# Patient Record
Sex: Female | Born: 1960 | Race: White | Hispanic: No | Marital: Married | Smoking: Never smoker
Health system: Southern US, Community
[De-identification: ages and names within clinical notes are randomized; demographics above are authoritative.]

## PROBLEM LIST (undated history)

## (undated) DIAGNOSIS — I10 Essential (primary) hypertension: Secondary | ICD-10-CM

## (undated) HISTORY — DX: Essential (primary) hypertension: I10

---

## 2009-07-29 ENCOUNTER — Ambulatory Visit (HOSPITAL_COMMUNITY): Admission: RE | Admit: 2009-07-29 | Discharge: 2009-07-29 | Payer: Self-pay | Admitting: Pulmonary Disease

## 2012-10-31 ENCOUNTER — Other Ambulatory Visit: Payer: Self-pay

## 2012-10-31 DIAGNOSIS — Z1231 Encounter for screening mammogram for malignant neoplasm of breast: Secondary | ICD-10-CM

## 2012-11-23 ENCOUNTER — Ambulatory Visit
Admission: RE | Admit: 2012-11-23 | Discharge: 2012-11-23 | Disposition: A | Discharge status: Home / Self Care | Payer: BC Managed Care – PPO | Source: Ambulatory Visit

## 2012-11-23 DIAGNOSIS — Z1231 Encounter for screening mammogram for malignant neoplasm of breast: Secondary | ICD-10-CM

## 2012-12-07 ENCOUNTER — Encounter: Payer: Self-pay | Admitting: Obstetrics and Gynecology

## 2012-12-17 ENCOUNTER — Ambulatory Visit: Payer: Self-pay | Admitting: Obstetrics and Gynecology

## 2012-12-28 ENCOUNTER — Ambulatory Visit (INDEPENDENT_AMBULATORY_CARE_PROVIDER_SITE_OTHER): Payer: BC Managed Care – PPO | Admitting: Obstetrics and Gynecology

## 2012-12-28 ENCOUNTER — Encounter: Payer: Self-pay | Admitting: Obstetrics and Gynecology

## 2012-12-28 VITALS — BP 130/76 | HR 80 | Ht 63.0 in | Wt 188.5 lb

## 2012-12-28 DIAGNOSIS — Z01419 Encounter for gynecological examination (general) (routine) without abnormal findings: Secondary | ICD-10-CM

## 2012-12-28 DIAGNOSIS — N951 Menopausal and female climacteric states: Secondary | ICD-10-CM

## 2012-12-28 DIAGNOSIS — Z1211 Encounter for screening for malignant neoplasm of colon: Secondary | ICD-10-CM

## 2012-12-28 DIAGNOSIS — R9389 Abnormal findings on diagnostic imaging of other specified body structures: Secondary | ICD-10-CM

## 2012-12-28 DIAGNOSIS — Z0000 Encounter for general adult medical examination without abnormal findings: Secondary | ICD-10-CM

## 2012-12-28 DIAGNOSIS — Z23 Encounter for immunization: Secondary | ICD-10-CM

## 2012-12-28 LAB — POCT URINALYSIS DIPSTICK
Leukocytes, UA: NEGATIVE
Nitrite, UA: NEGATIVE
pH, UA: 5

## 2012-12-28 NOTE — Progress Notes (Signed)
Patient tolerated Tdap well.  Given in right deltoid.

## 2012-12-28 NOTE — Patient Instructions (Addendum)
EXERCISE AND DIET:  We recommended that you start or continue a regular exercise program for good health. Regular exercise means any activity that makes your heart beat faster and makes you sweat.  We recommend exercising at least 30 minutes per day at least 3 days a week, preferably 4 or 5.  We also recommend a diet low in fat and sugar.  Inactivity, poor dietary choices and obesity can cause diabetes, heart attack, stroke, and kidney damage, among others.    ALCOHOL AND SMOKING:  Women should limit their alcohol intake to no more than 7 drinks/beers/glasses of wine (combined, not each!) per week. Moderation of alcohol intake to this level decreases your risk of breast cancer and liver damage. And of course, no recreational drugs are part of a healthy lifestyle.  And absolutely no smoking or even second hand smoke. Most people know smoking can cause heart and lung diseases, but did you know it also contributes to weakening of your bones? Aging of your skin?  Yellowing of your teeth and nails?  CALCIUM AND VITAMIN D:  Adequate intake of calcium and Vitamin D are recommended.  The recommendations for exact amounts of these supplements seem to change often, but generally speaking 600 mg of calcium (either carbonate or citrate) and 800 units of Vitamin D per day seems prudent. Certain women may benefit from higher intake of Vitamin D.  If you are among these women, your doctor will have told you during your visit.    PAP SMEARS:  Pap smears, to check for cervical cancer or precancers,  have traditionally been done yearly, although recent scientific advances have shown that most women can have pap smears less often.  However, every woman still should have a physical exam from her gynecologist every year. It will include a breast check, inspection of the vulva and vagina to check for abnormal growths or skin changes, a visual exam of the cervix, and then an exam to evaluate the size and shape of the uterus and  ovaries.  And after 50 years of age, a rectal exam is indicated to check for rectal cancers. We will also provide age appropriate advice regarding health maintenance, like when you should have certain vaccines, screening for sexually transmitted diseases, bone density testing, colonoscopy, mammograms, etc.   MAMMOGRAMS:  All women over 40 years old should have a yearly mammogram. Many facilities now offer a "3D" mammogram, which may cost around $50 extra out of pocket. If possible,  we recommend you accept the option to have the 3D mammogram performed.  It both reduces the number of women who will be called back for extra views which then turn out to be normal, and it is better than the routine mammogram at detecting truly abnormal areas.    COLONOSCOPY:  Colonoscopy to screen for colon cancer is recommended for all women at age 50.  We know, you hate the idea of the prep.  We agree, BUT, having colon cancer and not knowing it is worse!!  Colon cancer so often starts as a polyp that can be seen and removed at colonscopy, which can quite literally save your life!  And if your first colonoscopy is normal and you have no family history of colon cancer, most women don't have to have it again for 10 years.  Once every ten years, you can do something that may end up saving your life, right?  We will be happy to help you get it scheduled when you are ready.    Be sure to check your insurance coverage so you understand how much it will cost.  It may be covered as a preventative service at no cost, but you should check your particular policy.    Tetanus, Diphtheria, Pertussis (Tdap) Vaccine What You Need to Know WHY GET VACCINATED? Tetanus, diphtheria and pertussis can be very serious diseases, even for adolescents and adults. Tdap vaccine can protect us from these diseases. TETANUS (Lockjaw) causes painful muscle tightening and stiffness, usually all over the body.  It can lead to tightening of muscles in the head  and neck so you can't open your mouth, swallow, or sometimes even breathe. Tetanus kills about 1 out of 5 people who are infected. DIPHTHERIA can cause a thick coating to form in the back of the throat.  It can lead to breathing problems, paralysis, heart failure, and death. PERTUSSIS (Whooping Cough) causes severe coughing spells, which can cause difficulty breathing, vomiting and disturbed sleep.  It can also lead to weight loss, incontinence, and rib fractures. Up to 2 in 100 adolescents and 5 in 100 adults with pertussis are hospitalized or have complications, which could include pneumonia and death. These diseases are caused by bacteria. Diphtheria and pertussis are spread from person to person through coughing or sneezing. Tetanus enters the body through cuts, scratches, or wounds. Before vaccines, the [place] saw as many as 200,000 cases a year of diphtheria and pertussis, and hundreds of cases of tetanus. Since vaccination began, tetanus and diphtheria have dropped by about 99% and pertussis by about 80%. TDAP VACCINE Tdap vaccine can protect adolescents and adults from tetanus, diphtheria, and pertussis. One dose of Tdap is routinely given at age 11 or 12. People who did not get Tdap at that age should get it as soon as possible. Tdap is especially important for health care professionals and anyone having close contact with a baby younger than 12 months. Pregnant women should get a dose of Tdap during every pregnancy, to protect the newborn from pertussis. Infants are most at risk for severe, life-threatening complications from pertussis. A similar vaccine, called Td, protects from tetanus and diphtheria, but not pertussis. A Td booster should be given every 10 years. Tdap may be given as one of these boosters if you have not already gotten a dose. Tdap may also be given after a severe cut or burn to prevent tetanus infection. Your doctor can give you more information. Tdap may safely  be given at the same time as other vaccines. SOME PEOPLE SHOULD NOT GET THIS VACCINE  If you ever had a life-threatening allergic reaction after a dose of any tetanus, diphtheria, or pertussis containing vaccine, OR if you have a severe allergy to any part of this vaccine, you should not get Tdap. Tell your doctor if you have any severe allergies.  If you had a coma, or long or multiple seizures within 7 days after a childhood dose of DTP or DTaP, you should not get Tdap, unless a cause other than the vaccine was found. You can still get Td.  Talk to your doctor if you:  have epilepsy or another nervous system problem,  had severe pain or swelling after any vaccine containing diphtheria, tetanus or pertussis,  ever had Guillain-Barr Syndrome (GBS),  aren't feeling well on the day the shot is scheduled. RISKS OF A VACCINE REACTION With any medicine, including vaccines, there is a chance of side effects. These are usually mild and go away on their own, but serious   reactions are also possible. Brief fainting spells can follow a vaccination, leading to injuries from falling. Sitting or lying down for about 15 minutes can help prevent these. Tell your doctor if you feel dizzy or light-headed, or have vision changes or ringing in the ears. Mild problems following Tdap (Did not interfere with activities)  Pain where the shot was given (about 3 in 4 adolescents or 2 in 3 adults)  Redness or swelling where the shot was given (about 1 person in 5)  Mild fever of at least 100.75F (up to about 1 in 25 adolescents or 1 in 100 adults)  Headache (about 3 or 4 people in 10)  Tiredness (about 1 person in 3 or 4)  Nausea, vomiting, diarrhea, stomach ache (up to 1 in 4 adolescents or 1 in 10 adults)  Chills, body aches, sore joints, rash, swollen glands (uncommon) Moderate problems following Tdap (Interfered with activities, but did not require medical attention)  Pain where the shot was given  (about 1 in 5 adolescents or 1 in 100 adults)  Redness or swelling where the shot was given (up to about 1 in 16 adolescents or 1 in 25 adults)  Fever over 102F (about 1 in 100 adolescents or 1 in 250 adults)  Headache (about 3 in 20 adolescents or 1 in 10 adults)  Nausea, vomiting, diarrhea, stomach ache (up to 1 or 3 people in 100)  Swelling of the entire arm where the shot was given (up to about 3 in 100). Severe problems following Tdap (Unable to perform usual activities, required medical attention)  Swelling, severe pain, bleeding and redness in the arm where the shot was given (rare). A severe allergic reaction could occur after any vaccine (estimated less than 1 in a million doses). WHAT IF THERE IS A SERIOUS REACTION? What should I look for?  Look for anything that concerns you, such as signs of a severe allergic reaction, very high fever, or behavior changes. Signs of a severe allergic reaction can include hives, swelling of the face and throat, difficulty breathing, a fast heartbeat, dizziness, and weakness. These would start a few minutes to a few hours after the vaccination. What should I do?  If you think it is a severe allergic reaction or other emergency that can't wait, call 9-1-1 or get the person to the nearest hospital. Otherwise, call your doctor.  Afterward, the reaction should be reported to the "Vaccine Adverse Event Reporting System" (VAERS). Your doctor might file this report, or you can do it yourself through the VAERS web site at www.vaers.LAgents.no, or by calling 1-873-467-4987. VAERS is only for reporting reactions. They do not give medical advice.  THE NATIONAL VACCINE INJURY COMPENSATION PROGRAM The National Vaccine Injury Compensation Program (VICP) is a federal program that was created to compensate people who may have been injured by certain vaccines. Persons who believe they may have been injured by a vaccine can learn about the program and about filing a  claim by calling 1-(864) 053-5267 or visiting the VICP website at SpiritualWord.at. HOW CAN I LEARN MORE?  Ask your doctor.  Call your local or state health department.  Contact the Centers for Disease Control and Prevention (CDC):  Call (639) 038-0055 or visit CDC's website at PicCapture.uy. CDC Tdap Vaccine VIS (10/27/11) Document Released: 12/06/2011 Document Revised: 02/29/2012 Document Reviewed: 12/06/2011 ExitCare Patient Information 2014 [place], [place].    Please have your blood drawn after you have stopped your progesterone for 10 - 14 days.  I will see  you for your ultrasound appointment.

## 2012-12-28 NOTE — Progress Notes (Signed)
Patient ID: Cheyenne Christensen, female   DOB: 06/22/1960, 52 y.o.   MRN: [id number] 52 y.o.   Married    Caucasian   female   G2P2002   here for annual exam.    Patient with a history of dysfunctional uterine bleeding in early 2013.  Had a pelvic ultrasound and endometrial biopsy due to thickening of the endometrium at 15 mm.  EMB showed disordered proliferative endometrium.  No bleeding since June 2013, since starting progesterone therapy.  Patient has been running out of progesterone therapy.  When does not have Aygestin, has hot flashes.    Patient is concerned about potential elevated pressure.  Had 150/102.  Was having headaches.  Monitoring it and it has been 140s - 150s/90s - 100.  Had a normal BMET and urinalysis.  Dr. Juanetta Gosling who prescribed lisinopril, which the patient has now started.  Patient questioning if progesterone is causing elevation in blood pressure.  Here goal is to get off of her progesterone medication.    Asking about weight loss classes covered by [employer], her insurance company.    No LMP recorded. Patient is postmenopausal.          Sexually active: yes  The current method of family planning is post menopausal status and vasectomy Exercising: no Last mammogram:  11/2012 wnl:[facility] Last pap smear: 10/2011 wnl and negative HR HPV. History of abnormal pap: no Smoking: no Alcohol: no Last colonoscopy: never Last Bone Density:  never Last tetanus shot: 8-10 years ago - unsure. Last cholesterol check: 07/2012 wnl   Hgb: PCP                Urine: Trace RBC's   Family History  Problem Relation Age of Onset  . Hypertension Mother   . Deep vein thrombosis Mother   . Thyroid disease Mother   . Osteoporosis Mother   . Depression Mother   . Anxiety disorder Mother   . Thyroid disease Sister   . Anxiety disorder Sister   . Depression Sister     There are no active problems to display for this patient.   Past Medical History  Diagnosis Date  .  Hypertension     Past Surgical History  Procedure Laterality Date  . Cesarean section  1987    Allergies: Review of patient's allergies indicates no known allergies.  Current Outpatient Prescriptions  Medication Sig Dispense Refill  . Ibuprofen (ADVIL PO) Take by mouth.      Marland Kitchen lisinopril (PRINIVIL,ZESTRIL) 10 MG tablet Take 10 mg by mouth daily.      . norethindrone (AYGESTIN) 5 MG tablet Take 5 mg by mouth daily.       No current facility-administered medications for this visit.    ROS: Pertinent items are noted in HPI.  Social Hx:  Married.  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, [place]  Exam:    BP 130/76  Pulse 80  Ht 5\' 3"  (1.6 m)  Wt 188 lb 8 oz (85.503 kg)  BMI 33.4 kg/m2   Wt Readings from Last 3 Encounters:  12/28/12 188 lb 8 oz (85.503 kg)     Ht Readings from Last 3 Encounters:  12/28/12 5\' 3"  (1.6 m)    General appearance: alert, cooperative and appears stated age Head: Normocephalic, without obvious abnormality, atraumatic Neck: no adenopathy, supple, symmetrical, trachea midline and thyroid not enlarged, symmetric, no tenderness/mass/nodules Lungs: clear to auscultation bilaterally Breasts: Inspection negative, No nipple retraction or dimpling, No nipple  discharge or bleeding, No axillary or supraclavicular adenopathy, Normal to palpation without dominant masses Heart: regular rate and rhythm Abdomen: soft, non-tender; bowel sounds normal; no masses,  no organomegaly Extremities: extremities normal, atraumatic, no cyanosis or edema Skin: Skin color, texture, turgor normal. No rashes or lesions Lymph nodes: Cervical, supraclavicular, and axillary nodes normal. No abnormal inguinal nodes palpated Neurologic: Grossly normal   Pelvic: External genitalia:  no lesions              Urethra:  normal appearing urethra with no masses, tenderness or lesions              Bartholins and Skenes: normal                 Vagina: normal appearing vagina with  normal color and discharge, no lesions.  Small pea size left apical vaginal wall cyst palpable, soft.              Cervix: normal appearance              Pap taken: no        Bimanual Exam:  Uterus:  uterus is normal size, shape, consistency and nontender                                      Adnexa: normal adnexa in size, nontender and no masses                                      Rectovaginal: Confirms                                      Anus:  normal sphincter tone, no lesions  A: normal menopausal exam History of dysfunctional uterine bleeding and thickened endometrium.  On daily progesterone. Menopausal symptoms New diagnosis of hypertension. Obesity     P: mammogram yearly Do self breast exams pap smear in 2018. Stop progesterone and have FSH and estradiol checked 10 - 14 days later at [facility] lab in [place]. Return for pelvic ultrasound to re-evaluate lining. TDap today. Patient will contact [employer] to see what classes she is eligible for for weight loss. Return annually also.     An After Visit Summary was printed and given to the patient.

## 2013-01-02 ENCOUNTER — Encounter: Payer: Self-pay | Admitting: Internal Medicine

## 2013-01-02 ENCOUNTER — Telehealth: Payer: Self-pay | Admitting: Obstetrics and Gynecology

## 2013-01-02 NOTE — Telephone Encounter (Signed)
LMTCB to discuss insurance benefits.

## 2013-01-24 ENCOUNTER — Telehealth: Payer: Self-pay | Admitting: Obstetrics and Gynecology

## 2013-01-24 NOTE — Addendum Note (Signed)
Addended by: Clide Dales R on: 01/24/2013 10:58 AM   Modules accepted: Orders

## 2013-01-24 NOTE — Telephone Encounter (Signed)
Patient notified of lab orders have been release to [facility] on [address]  And may have lab work drawn now.

## 2013-01-24 NOTE — Telephone Encounter (Signed)
Patient has orders with [facility]  for lab work. However when she went to the location in [place] they could not fine orders

## 2013-02-05 ENCOUNTER — Other Ambulatory Visit: Payer: Self-pay | Admitting: Obstetrics and Gynecology

## 2013-02-05 LAB — ESTRADIOL: Estradiol: 108.7 pg/mL

## 2013-02-05 MED ORDER — NORETHINDRONE ACETATE 5 MG PO TABS: 5.0000 mg | ORAL_TABLET | Freq: Every day | ORAL | Status: DC

## 2013-02-06 ENCOUNTER — Telehealth: Payer: Self-pay

## 2013-02-06 NOTE — Telephone Encounter (Signed)
Message copied by Alphonsa Overall on Wed Feb 06, 2013  4:19 PM ------      Message from: Conley Simmonds      Created: Tue Feb 05, 2013  6:35 PM       Please inform the patient that she is not in full menopause yet.  Her natural estrogen levels are still high, and she is at risk for developing endometrial hyperplasia.      I recommend restarting her progesterone daily, and I will send a refill to her pharmacy through Epic.      I recommend proceeding with a pelvic ultrasound to evaluate the lining of her uterus.  If it is thickened, I recommend proceeding with an office biopsy as well. ------

## 2013-02-06 NOTE — Telephone Encounter (Signed)
LMOVM to call for lab results. 

## 2013-02-07 ENCOUNTER — Ambulatory Visit (INDEPENDENT_AMBULATORY_CARE_PROVIDER_SITE_OTHER): Payer: BC Managed Care – PPO

## 2013-02-07 ENCOUNTER — Ambulatory Visit (INDEPENDENT_AMBULATORY_CARE_PROVIDER_SITE_OTHER): Payer: BC Managed Care – PPO | Admitting: Obstetrics and Gynecology

## 2013-02-07 ENCOUNTER — Encounter: Payer: Self-pay | Admitting: Obstetrics and Gynecology

## 2013-02-07 VITALS — BP 122/82 | HR 80 | Ht 63.0 in | Wt 185.0 lb

## 2013-02-07 DIAGNOSIS — R9389 Abnormal findings on diagnostic imaging of other specified body structures: Secondary | ICD-10-CM

## 2013-02-07 NOTE — Telephone Encounter (Signed)
Patient given lab results and told to restart progesterone.  She has appointment for pelvic ultrasound tomorrow and advised pt. To keep Appointment and can discuss with Dr. Edward Jolly further at that time.

## 2013-02-07 NOTE — Progress Notes (Signed)
Subjective  Patient her for pelvic ultrasound and endometrial biopsy.  Has a history of thickened endometrium 15 mm on ultrasound 10/2011 at outside office. Had endometrial biopsy 11/2011 at [facility] office showing disordered proliferative endometrium, which prompted the progesterone treatment. Prescribed Aygestin 5 mg daily, which she stopped in June 2014.  No menses during treatment or after stopped Rx. FSH 34 and estradiol 108.7 on 02/04/13 at this office  Brother in law deceased this week due to massive MI.  Objective   See ultrasound below.  Uterus with EMS 11 mm and cystic changes. Ovarian follicles noted.     Endometrial biopsy  Consent performed. Speculum placed in the vagina.  Sterile prep with Hibiclens. Tenaculum to anterior cervical lip. Pipelle passed twice into endometrial cavity and suction applied each time.  Tissue to pathology. Minimal EBL. No complications  Assessment  Perimenopausal female. Cystic endometrium. Recent discontinuation of Aygestin.  Plan  Will follow up on endometrial biopsy. Anticipate continuation of progesterone therapy, but will wait for the biopsy results to return.

## 2013-02-07 NOTE — Patient Instructions (Addendum)
Endometrial Biopsy This is a test in which a tissue sample (a biopsy) is taken from inside the uterus (womb). It is then looked at by a specialist under a microscope to see if the tissue is normal or abnormal. The endometrium is the lining of the uterus. This test helps determine where you are in your menstrual cycle and how hormone levels are affecting the lining of the uterus. Another use for this test is to diagnose endometrial cancer, tuberculosis, polyps, or inflammatory conditions and to evaluate uterine bleeding. PREPARATION FOR TEST No preparation or fasting is necessary. NORMAL FINDINGS No pathologic conditions. Presence of "secretory-type" endometrium 3 to 5 days before to normal menstruation. Ranges for normal findings may vary among different laboratories and hospitals. You should always check with your doctor after having lab work or other tests done to discuss the meaning of your test results and whether your values are considered within normal limits. MEANING OF TEST  Your caregiver will go over the test results with you and discuss the importance and meaning of your results, as well as treatment options and the need for additional tests if necessary. OBTAINING THE TEST RESULTS It is your responsibility to obtain your test results. Ask the lab or department performing the test when and how you will get your results. Document Released: 10/07/2004 Document Revised: 08/29/2011 Document Reviewed: 05/16/2008 ExitCare Patient Information 2014 [employer].  

## 2013-02-11 ENCOUNTER — Telehealth: Payer: Self-pay | Admitting: Obstetrics and Gynecology

## 2013-02-11 NOTE — Telephone Encounter (Signed)
Spoke with patient and advised her I have not called her today.  Patient states she had a message stating to call me.  When patient actually Looks at date of call it was dated 02-06-13.  Advised patient probably a message to call to keep appointment for ultrasound and to start Back on progesterone.  Advised pt. Will call her with endometrial bx results when we receive them.

## 2013-02-11 NOTE — Telephone Encounter (Signed)
Patient calling you back 

## 2013-02-12 ENCOUNTER — Telehealth: Payer: Self-pay | Admitting: Obstetrics and Gynecology

## 2013-02-12 LAB — IPS CERVICAL/ECC/EMB/VULVAR/VAGINAL BIOPSY

## 2013-02-12 NOTE — Telephone Encounter (Signed)
Phone call with patient directly to share endometrial biopsy results.  Benign endometrial polyp.  I recommend proceeding with polyp removal and dilation and curettage.  Patient will now schedule an appointment to discuss this further with me.

## 2013-02-22 ENCOUNTER — Encounter: Payer: Self-pay | Admitting: Obstetrics and Gynecology

## 2013-02-22 ENCOUNTER — Ambulatory Visit (INDEPENDENT_AMBULATORY_CARE_PROVIDER_SITE_OTHER): Payer: BC Managed Care – PPO | Admitting: Obstetrics and Gynecology

## 2013-02-22 VITALS — BP 122/82 | HR 80 | Ht 63.0 in | Wt 189.0 lb

## 2013-02-22 DIAGNOSIS — N840 Polyp of corpus uteri: Secondary | ICD-10-CM

## 2013-02-22 NOTE — Patient Instructions (Signed)
Please refer to your handout on hysteroscopy and dilation and curettage.

## 2013-02-24 ENCOUNTER — Encounter: Payer: Self-pay | Admitting: Obstetrics and Gynecology

## 2013-02-24 NOTE — Progress Notes (Signed)
Patient ID: Cheyenne Christensen, female   DOB: 30-Dec-1960, 52 y.o.   MRN: [id number]  Subjective  Patient, husband, and daughter present for discussion of surgery.   Has a history of thickened endometrium 15 mm on ultrasound 10/2011 at outside office.  Had endometrial biopsy 11/2011 at same office showing disordered proliferative endometrium, which prompted the progesterone treatment.  Prescribed Aygestin 5 mg daily, which she stopped in June 2014. No menses during treatment or after stopped Rx.   FSH 34 and estradiol 108.7 on 02/04/13 at this office  Had pelvic ultrasound 02/07/13 showing uterus with EMS 11 mm and cystic changes. Ovarian follicles noted.  Endometrial biopsy 02/07/13 - disordered proliferative endometrium suggestive of a polyp.  Objective  No exam.  Assessment  Perimenopausal female. Endometrial polyp.  Plan  Proceed with hysteroscopic polypectomy with dilation and curettage.  Risks, benefits, and alternatives discussed.  Risks include but are not limited to bleeding, infection, damage to surrounding organs, uterine perforation, DVT, PE, death, reaction to anesthesia, and pulmonary edema.  Patient wishes to proceed.

## 2013-03-01 ENCOUNTER — Encounter: Payer: BC Managed Care – PPO | Admitting: Internal Medicine

## 2013-03-18 ENCOUNTER — Encounter: Payer: Self-pay | Admitting: Obstetrics and Gynecology

## 2013-03-19 ENCOUNTER — Telehealth: Payer: Self-pay | Admitting: *Deleted

## 2013-03-19 NOTE — Telephone Encounter (Signed)
Patient returned call.  She has previously talked with Carolynn in insurance and told her she wanted to hold off on scheduling surgery.  Advised that this is a diagnostic procedure that is being recommended to rule out any abnormal cells as the cause of her bleeding and thickened endometrium. Patient states she has a mom with cancer and a grandson's birthday as well as commitments at work that she feels she "wants' to take care of prior to procedure.  Also needs this time to arrange payment.  She feels like the earliest she can schedule is end of Oct and/or early Nov.  Advised again, need to rule out abnormal cells and would not recommend waiting beyond this time frame.    Patient states she has not taken amy Aygestin since June after seeing Dr Edward Jolly in July and having labs done.Hasnt had bleeding since then until started spotting over last two days and notices a gradual increasing of flow.  Discussed that this may persist until procedure.  Patient is agreeable to schedule for OCT 28.  Anything to do before then?   Will schedule procedure and then call her to schedule pre/post op.

## 2013-03-19 NOTE — Telephone Encounter (Signed)
Kennon Rounds,  Thank you for contacting the patient to schedule surgery.  Nothing to do in the interim.  Her prior progesterone therapy was prevention against hyperplasia, although the patient never had this.  Now she actually has endometrial pathology, a polyp.  Have her just keep a bleeding calendar and contact office if she develops heavy bleeding.  [employer]

## 2013-03-19 NOTE — Telephone Encounter (Signed)
Call to patient to follow up on scheduling surgical procedure. LMTCB.

## 2013-03-20 NOTE — Telephone Encounter (Signed)
LM, everything scheduled as we discussed.  Call back at her convenince to review instructions from dr Edward Jolly regarding Aygestin and surgery instructions.

## 2013-03-20 NOTE — Telephone Encounter (Signed)
Patient returned call.  Given instructions to monitor and track bleeding and call if becomes heavy. No Aygestin for now.  Patient states it is continuing to increase.  Discussed surgical date of 04-16-13 and instructions given, mailed and pre/post op appts scheduled.

## 2013-03-21 ENCOUNTER — Telehealth: Payer: Self-pay | Admitting: Obstetrics and Gynecology

## 2013-03-21 NOTE — Telephone Encounter (Signed)
Spoke with patient about her payment due date for surgery. Patient will pay on 10/16 at her pre-op appt.

## 2013-04-02 ENCOUNTER — Encounter (HOSPITAL_COMMUNITY): Payer: Self-pay

## 2013-04-04 ENCOUNTER — Encounter: Payer: Self-pay | Admitting: Obstetrics and Gynecology

## 2013-04-04 ENCOUNTER — Ambulatory Visit (INDEPENDENT_AMBULATORY_CARE_PROVIDER_SITE_OTHER): Payer: BC Managed Care – PPO | Admitting: Obstetrics and Gynecology

## 2013-04-04 VITALS — BP 120/74 | HR 60 | Temp 98.0°F | Ht 63.0 in | Wt 183.0 lb

## 2013-04-04 DIAGNOSIS — N840 Polyp of corpus uteri: Secondary | ICD-10-CM

## 2013-04-04 NOTE — Progress Notes (Signed)
Patient ID: Cheyenne Christensen, female   DOB: 06/05/1961, 52 y.o.   MRN: [id number] GYNECOLOGY PROBLEM VISIT  PCP:  Shaune Pollack  Referring provider:   HPI: 52 y.o.   Married  Caucasian  female   G2P2002 with No LMP recorded. Patient is postmenopausal.   here for   Discuss surgery.  Scheduled for October 28.   Bleeding episode started 03/17/13 and stopped a little over a week ago.    Has a history of thickened endometrium 15 mm on ultrasound 10/2011 at outside office.  Had endometrial biopsy 11/2011 at same office showing disordered proliferative endometrium, which prompted the progesterone treatment.  Prescribed Aygestin 5 mg daily, which she stopped in June 2014. No menses during treatment or after stopped Rx.  FSH 34 and estradiol 108.7 on 02/04/13 at this office  Had pelvic ultrasound 02/07/13 showing uterus with EMS 11 mm and cystic changes. Ovarian follicles noted.  Endometrial biopsy 02/07/13 - disordered proliferative endometrium suggestive of a polyp.   GYNECOLOGIC HISTORY: No LMP recorded. Patient is postmenopausal. Sexually active:  yes Partner preference: female Contraception:   postmenopausal Menopausal hormone therapy: no DES exposure:   no Blood transfusions:  no  Sexually transmitted diseases:  no GYN Procedures:  C-section 4098 Mammogram:   11/2012 wnl:[facility]              Pap:   10/2011 wnl and Neg HR HPV History of abnormal pap smear:  no   OB History   Grav Para Term Preterm Abortions TAB SAB Ect Mult Living   2 2 2       2          Family History  Problem Relation Age of Onset  . Hypertension Mother   . Deep vein thrombosis Mother   . Thyroid disease Mother   . Osteoporosis Mother   . Depression Mother   . Anxiety disorder Mother   . Thyroid disease Sister   . Anxiety disorder Sister   . Depression Sister     There are no active problems to display for this patient.   Past Medical History  Diagnosis Date  . Hypertension     Past Surgical  History  Procedure Laterality Date  . Cesarean section  1987    ALLERGIES: Review of patient's allergies indicates no known allergies.  Current Outpatient Prescriptions  Medication Sig Dispense Refill  . acetaminophen (TYLENOL) 500 MG tablet Take 1,000 mg by mouth daily as needed for pain.      Marland Kitchen lisinopril (PRINIVIL,ZESTRIL) 10 MG tablet Take 10 mg by mouth daily.      . Ibuprofen (ADVIL PO) Take 400-600 mg by mouth daily as needed (pain).        No current facility-administered medications for this visit.     ROS:  Pertinent items are noted in HPI.  SOCIAL HISTORY:  Married.   PHYSICAL EXAMINATION:    BP 120/74  Pulse 60  Temp(Src) 98 F (36.7 C) (Oral)  Ht 5\' 3"  (1.6 m)  Wt 183 lb (83.008 kg)  BMI 32.43 kg/m2   Wt Readings from Last 3 Encounters:  04/04/13 183 lb (83.008 kg)  02/22/13 189 lb (85.73 kg)  02/07/13 185 lb (83.915 kg)     Ht Readings from Last 3 Encounters:  04/04/13 5\' 3"  (1.6 m)  02/22/13 5\' 3"  (1.6 m)  02/07/13 5\' 3"  (1.6 m)    General appearance: alert, cooperative and appears stated age Head: Normocephalic, without obvious abnormality, atraumatic Neck: no  adenopathy, supple, symmetrical, trachea midline and thyroid not enlarged, symmetric, no tenderness/mass/nodules Lungs: clear to auscultation bilaterally Breasts: Inspection negative, No nipple retraction or dimpling, No nipple discharge or bleeding, No axillary or supraclavicular adenopathy, Normal to palpation without dominant masses Heart: regular rate and rhythm Abdomen: soft, non-tender; no masses,  no organomegaly Extremities: extremities normal, atraumatic, no cyanosis or edema Skin: Skin color, texture, turgor normal. No rashes or lesions Lymph nodes: Cervical, supraclavicular, and axillary nodes normal. No abnormal inguinal nodes palpated Neurologic: Grossly normal  Pelvic: External genitalia:  no lesions              Urethra:  normal appearing urethra with no masses, tenderness or  lesions              Bartholins and Skenes: normal                 Vagina: normal appearing vagina with normal color and discharge, no lesions              Cervix: normal appearance                      Bimanual Exam:  Uterus:  uterus is normal size, shape, consistency and nontender                                      Adnexa: normal adnexa in size, nontender and no masses                                      Rectovaginal: Confirms                                      Anus:  normal sphincter tone, no lesions  ASSESSMENT   Perimenopausal female.  Endometrial polyp.  Previous Aygestin therapy.   PLAN  Proceed with hysteroscopic polypectomy with dilation and curettage. Risks, benefits, and alternatives discussed. Risks include but are not limited to bleeding, infection, damage to surrounding organs, uterine perforation, DVT, PE, death, reaction to anesthesia, and pulmonary edema. Patient wishes to proceed.      An After Visit Summary was printed and given to the patient.

## 2013-04-04 NOTE — Patient Instructions (Signed)
I will see you the day of surgery!  Please call if you need anything before then.

## 2013-04-11 ENCOUNTER — Encounter (HOSPITAL_COMMUNITY): Payer: Self-pay

## 2013-04-11 ENCOUNTER — Encounter (HOSPITAL_COMMUNITY)
Admission: RE | Admit: 2013-04-11 | Discharge: 2013-04-11 | Disposition: A | Discharge status: Home / Self Care | Payer: BC Managed Care – PPO | Source: Ambulatory Visit | Attending: Obstetrics and Gynecology | Admitting: Obstetrics and Gynecology

## 2013-04-11 ENCOUNTER — Other Ambulatory Visit: Payer: Self-pay

## 2013-04-11 DIAGNOSIS — Z01812 Encounter for preprocedural laboratory examination: Secondary | ICD-10-CM | POA: Insufficient documentation

## 2013-04-11 DIAGNOSIS — Z01818 Encounter for other preprocedural examination: Secondary | ICD-10-CM | POA: Insufficient documentation

## 2013-04-11 DIAGNOSIS — Z01810 Encounter for preprocedural cardiovascular examination: Secondary | ICD-10-CM | POA: Insufficient documentation

## 2013-04-11 LAB — BASIC METABOLIC PANEL
Calcium: 9.2 mg/dL (ref 8.4–10.5)
Creatinine, Ser: 0.7 mg/dL (ref 0.50–1.10)
GFR calc non Af Amer: >90 mL/min (ref >90)
Sodium: 136 mEq/L (ref 135–145)

## 2013-04-11 LAB — CBC
Hemoglobin: 13.6 g/dL (ref 12.0–15.0)
Platelets: 230 10*3/uL (ref 150–400)
RBC: 4.41 MIL/uL (ref 3.87–5.11)
WBC: 4.7 10*3/uL (ref 4.0–10.5)

## 2013-04-11 NOTE — Patient Instructions (Addendum)
   Your procedure is scheduled on: Tuesday, Oct 28  Enter through the Main Entrance of [facility] at:  10 am Pick up the phone at the desk and dial [phone] and inform us of your arrival.  Please call this number if you have any problems the morning of surgery: [phone]  Remember: Do not eat or drink after midnight: Monday Take these medicines the morning of surgery with a SIP OF WATER: none  Do not wear jewelry, make-up, or FINGER nail polish No metal in your hair or on your body. Do not wear lotions, powders, perfumes. You may wear deodorant.  Please use your CHG wash as directed prior to surgery.  Do not shave anywhere for at least 12 hours prior to first CHG shower.  Do not bring valuables to the hospital. Contacts, dentures or bridgework may not be worn into surgery.  Patients discharged on the day of surgery will not be allowed to drive home.  Home with husband Gery Pray [phone].

## 2013-04-16 ENCOUNTER — Encounter (HOSPITAL_COMMUNITY): Payer: BC Managed Care – PPO | Admitting: Certified Registered Nurse Anesthetist

## 2013-04-16 ENCOUNTER — Encounter (HOSPITAL_COMMUNITY)
Admission: RE | Disposition: A | Discharge status: Home / Self Care | Payer: Self-pay | Source: Ambulatory Visit | Attending: Obstetrics and Gynecology

## 2013-04-16 ENCOUNTER — Ambulatory Visit (HOSPITAL_COMMUNITY)
Admission: RE | Admit: 2013-04-16 | Discharge: 2013-04-16 | Disposition: A | Discharge status: Home / Self Care | Payer: BC Managed Care – PPO | Source: Ambulatory Visit | Attending: Obstetrics and Gynecology | Admitting: Obstetrics and Gynecology

## 2013-04-16 ENCOUNTER — Ambulatory Visit (HOSPITAL_COMMUNITY): Payer: BC Managed Care – PPO | Admitting: Certified Registered Nurse Anesthetist

## 2013-04-16 DIAGNOSIS — R9389 Abnormal findings on diagnostic imaging of other specified body structures: Secondary | ICD-10-CM | POA: Insufficient documentation

## 2013-04-16 DIAGNOSIS — N840 Polyp of corpus uteri: Secondary | ICD-10-CM | POA: Insufficient documentation

## 2013-04-16 DIAGNOSIS — N8500 Endometrial hyperplasia, unspecified: Secondary | ICD-10-CM

## 2013-04-16 HISTORY — PX: DILATATION & CURRETTAGE/HYSTEROSCOPY WITH RESECTOCOPE: SHX5572

## 2013-04-16 SURGERY — DILATATION & CURETTAGE/HYSTEROSCOPY WITH RESECTOCOPE
Anesthesia: General | Site: Vagina | Wound class: Clean Contaminated

## 2013-04-16 MED ORDER — KETOROLAC TROMETHAMINE 30 MG/ML IJ SOLN
INTRAMUSCULAR | Status: AC
  Filled 2013-04-16: qty 1

## 2013-04-16 MED ORDER — LIDOCAINE HCL (CARDIAC) 20 MG/ML IV SOLN
INTRAVENOUS | Status: DC | PRN
  Administered 2013-04-16: 50 mg via INTRAVENOUS

## 2013-04-16 MED ORDER — LIDOCAINE HCL (CARDIAC) 20 MG/ML IV SOLN
INTRAVENOUS | Status: AC
  Filled 2013-04-16: qty 5

## 2013-04-16 MED ORDER — MIDAZOLAM HCL 2 MG/2ML IJ SOLN
INTRAMUSCULAR | Status: AC
  Filled 2013-04-16: qty 2

## 2013-04-16 MED ORDER — DEXAMETHASONE SODIUM PHOSPHATE 10 MG/ML IJ SOLN
INTRAMUSCULAR | Status: DC | PRN
  Administered 2013-04-16: 10 mg via INTRAVENOUS

## 2013-04-16 MED ORDER — PROMETHAZINE HCL 25 MG/ML IJ SOLN: 6.2500 mg | INTRAMUSCULAR | Status: DC | PRN

## 2013-04-16 MED ORDER — LIDOCAINE HCL 1 % IJ SOLN
INTRAMUSCULAR | Status: DC | PRN
  Administered 2013-04-16: 10 mL

## 2013-04-16 MED ORDER — MIDAZOLAM HCL 2 MG/2ML IJ SOLN
INTRAMUSCULAR | Status: DC | PRN
  Administered 2013-04-16: 2 mg via INTRAVENOUS

## 2013-04-16 MED ORDER — GLYCOPYRROLATE 0.2 MG/ML IJ SOLN
INTRAMUSCULAR | Status: AC
  Filled 2013-04-16: qty 1

## 2013-04-16 MED ORDER — FENTANYL CITRATE 0.05 MG/ML IJ SOLN
INTRAMUSCULAR | Status: AC
  Filled 2013-04-16: qty 2

## 2013-04-16 MED ORDER — ONDANSETRON HCL 4 MG/2ML IJ SOLN
INTRAMUSCULAR | Status: DC | PRN
  Administered 2013-04-16: 4 mg via INTRAVENOUS

## 2013-04-16 MED ORDER — LIDOCAINE HCL 1 % IJ SOLN
INTRAMUSCULAR | Status: AC
  Filled 2013-04-16: qty 20

## 2013-04-16 MED ORDER — GLYCINE 1.5 % IR SOLN
Status: DC | PRN
  Administered 2013-04-16: 3000 mL

## 2013-04-16 MED ORDER — DEXAMETHASONE SODIUM PHOSPHATE 10 MG/ML IJ SOLN
INTRAMUSCULAR | Status: AC
  Filled 2013-04-16: qty 1

## 2013-04-16 MED ORDER — ONDANSETRON HCL 4 MG/2ML IJ SOLN
INTRAMUSCULAR | Status: AC
  Filled 2013-04-16: qty 2

## 2013-04-16 MED ORDER — LACTATED RINGERS IV SOLN: INTRAVENOUS | Status: DC

## 2013-04-16 MED ORDER — FENTANYL CITRATE 0.05 MG/ML IJ SOLN: 25.0000 ug | INTRAMUSCULAR | Status: DC | PRN

## 2013-04-16 MED ORDER — KETOROLAC TROMETHAMINE 30 MG/ML IJ SOLN
INTRAMUSCULAR | Status: DC | PRN
  Administered 2013-04-16: 30 mg via INTRAVENOUS

## 2013-04-16 MED ORDER — LACTATED RINGERS IV SOLN
INTRAVENOUS | Status: DC
  Administered 2013-04-16 (×2): via INTRAVENOUS

## 2013-04-16 MED ORDER — MIDAZOLAM HCL 2 MG/2ML IJ SOLN: 0.5000 mg | Freq: Once | INTRAMUSCULAR | Status: DC | PRN

## 2013-04-16 MED ORDER — PROPOFOL 10 MG/ML IV BOLUS
INTRAVENOUS | Status: DC | PRN
  Administered 2013-04-16: 200 mg via INTRAVENOUS

## 2013-04-16 MED ORDER — FENTANYL CITRATE 0.05 MG/ML IJ SOLN
INTRAMUSCULAR | Status: DC | PRN
  Administered 2013-04-16 (×2): 50 ug via INTRAVENOUS

## 2013-04-16 MED ORDER — KETOROLAC TROMETHAMINE 30 MG/ML IJ SOLN: 15.0000 mg | Freq: Once | INTRAMUSCULAR | Status: DC | PRN

## 2013-04-16 MED ORDER — GLYCOPYRROLATE 0.2 MG/ML IJ SOLN
INTRAMUSCULAR | Status: DC | PRN
  Administered 2013-04-16: 0.2 mg via INTRAVENOUS

## 2013-04-16 MED ORDER — MEPERIDINE HCL 25 MG/ML IJ SOLN: 6.2500 mg | INTRAMUSCULAR | Status: DC | PRN

## 2013-04-16 SURGICAL SUPPLY — 18 items
CANISTER SUCT 3000ML (MISCELLANEOUS) ×2 IMPLANT
CATH ROBINSON RED A/P 16FR (CATHETERS) ×2 IMPLANT
CLOTH BEACON ORANGE TIMEOUT ST (SAFETY) ×2 IMPLANT
CONTAINER PREFILL 10% NBF 60ML (FORM) ×4 IMPLANT
DRESSING TELFA 8X3 (GAUZE/BANDAGES/DRESSINGS) ×2 IMPLANT
ELECT REM PT RETURN 9FT ADLT (ELECTROSURGICAL) ×2
ELECTRODE REM PT RTRN 9FT ADLT (ELECTROSURGICAL) IMPLANT
GLOVE BIO SURGEON STRL SZ 6.5 (GLOVE) ×2 IMPLANT
GLOVE BIOGEL PI IND STRL 7.0 (GLOVE) ×1 IMPLANT
GLOVE BIOGEL PI INDICATOR 7.0 (GLOVE) ×1
GOWN STRL REIN XL XLG (GOWN DISPOSABLE) ×4 IMPLANT
LOOP ANGLED CUTTING 22FR (CUTTING LOOP) IMPLANT
NDL SPNL 20GX3.5 QUINCKE YW (NEEDLE) IMPLANT
NEEDLE SPNL 20GX3.5 QUINCKE YW (NEEDLE) ×2 IMPLANT
PACK HYSTEROSCOPY LF (CUSTOM PROCEDURE TRAY) ×2 IMPLANT
PAD OB MATERNITY 4.3X12.25 (PERSONAL CARE ITEMS) ×2 IMPLANT
TOWEL OR 17X24 6PK STRL BLUE (TOWEL DISPOSABLE) ×4 IMPLANT
WATER STERILE IRR 1000ML POUR (IV SOLUTION) ×2 IMPLANT

## 2013-04-16 NOTE — Anesthesia Postprocedure Evaluation (Signed)
Anesthesia Post Note  Patient: Cheyenne Christensen  Procedure(s) Performed: Procedure(s) (LRB): DILATATION & CURETTAGE/HYSTEROSCOPY (N/A)  Anesthesia type: General  Patient location: PACU  Post pain: Pain level controlled  Post assessment: Post-op Vital signs reviewed  Last Vitals:  Filed Vitals:   04/16/13 1236  BP: 113/84  Pulse: 93  Temp: 36.6 C  Resp: 16    Post vital signs: Reviewed  Level of consciousness: sedated  Complications: No apparent anesthesia complications

## 2013-04-16 NOTE — Transfer of Care (Signed)
Immediate Anesthesia Transfer of Care Note  Patient: Cheyenne Christensen  Procedure(s) Performed: Procedure(s): DILATATION & CURETTAGE/HYSTEROSCOPY (N/A)  Patient Location: PACU  Anesthesia Type:General  Level of Consciousness: awake, alert  and oriented  Airway & Oxygen Therapy: Patient Spontanous Breathing and Patient connected to nasal cannula oxygen  Post-op Assessment: Report given to PACU RN, Post -op Vital signs reviewed and stable and Patient moving all extremities X 4  Post vital signs: Reviewed and stable  Complications: No apparent anesthesia complications

## 2013-04-16 NOTE — Brief Op Note (Signed)
04/16/2013  12:35 PM  PATIENT:  Cheyenne Christensen  52 y.o. female  PRE-OPERATIVE DIAGNOSIS:  endometrial polyp with thickened endometrial lining. CPT [id number]  POST-OPERATIVE DIAGNOSIS:  Thickened endometrial lining.  PROCEDURE:  Procedure(s): DILATATION & CURETTAGE/HYSTEROSCOPY (N/A)  SURGEON:  Surgeon(s) and Role:    * Melony Overly, MD - Primary  PHYSICIAN ASSISTANT:   ASSISTANTS: none   ANESTHESIA:   LMA, paracervical block  EBL:  Total I/O In: 1500 [I.V.:1500] Out: 55 [Urine:50; Blood:5]  BLOOD ADMINISTERED:none  DRAINS: none   LOCAL MEDICATIONS USED:  LIDOCAINE  and Amount:  10 ml  SPECIMEN:  Source of Specimen:   endometrial curettings  DISPOSITION OF SPECIMEN:  PATHOLOGY  COUNTS:  YES  TOURNIQUET:  * No tourniquets in log *  DICTATION: .Other Dictation: Dictation Number    PLAN OF CARE: Discharge to home after PACU  PATIENT DISPOSITION:  PACU - hemodynamically stable.   Delay start of Pharmacological VTE agent (>24hrs) due to surgical blood loss or risk of bleeding: not applicable

## 2013-04-16 NOTE — H&P (Signed)
Patient ID: Cheyenne Christensen, female   DOB: [birth year], 52 y.o.   MRN: [id number] GYNECOLOGY PROBLEM VISIT  PCP:  Shaune Pollack  Referring provider:   HPI: 52 y.o.   Married  Caucasian  female    G2P2002 with No LMP recorded. Patient is postmenopausal.    here for   Discuss surgery.  Scheduled for October 28.   Bleeding episode started 03/17/13 and stopped a little over a week ago.    Has a history of thickened endometrium 15 mm on ultrasound 10/2011 at outside office.   Had endometrial biopsy 11/2011 at same office showing disordered proliferative endometrium, which prompted the progesterone treatment.   Prescribed Aygestin 5 mg daily, which she stopped in June 2014. No menses during treatment or after stopped Rx.   FSH 34 and estradiol 108.7 on 02/04/13 at this office   Had pelvic ultrasound 02/07/13 showing uterus with EMS 11 mm and cystic changes. Ovarian follicles noted.   Endometrial biopsy 02/07/13 - disordered proliferative endometrium suggestive of a polyp.   GYNECOLOGIC HISTORY: No LMP recorded. Patient is postmenopausal. Sexually active:  yes Partner preference: female Contraception:   postmenopausal Menopausal hormone therapy: no DES exposure:   no Blood transfusions:  no   Sexually transmitted diseases:  no GYN Procedures:  C-section 5621 Mammogram:   11/2012 wnl:[facility]               Pap:   10/2011 wnl and Neg HR HPV History of abnormal pap smear:  no    OB History     Grav  Para  Term  Preterm  Abortions  TAB  SAB  Ect  Mult  Living     2  2  2              2              Family History   Problem  Relation  Age of Onset   .  Hypertension  Mother     .  Deep vein thrombosis  Mother     .  Thyroid disease  Mother     .  Osteoporosis  Mother     .  Depression  Mother     .  Anxiety disorder  Mother     .  Thyroid disease  Sister     .  Anxiety disorder  Sister     .  Depression  Sister       There are no active problems to display for this patient.    Past Medical History   Diagnosis  Date   .  Hypertension         Past Surgical History   Procedure  Laterality  Date   .  Cesarean section    1987     ALLERGIES: Review of patient's allergies indicates no known allergies.    Current Outpatient Prescriptions   Medication  Sig  Dispense  Refill   .  acetaminophen (TYLENOL) 500 MG tablet  Take 1,000 mg by mouth daily as needed for pain.         Marland Kitchen  lisinopril (PRINIVIL,ZESTRIL) 10 MG tablet  Take 10 mg by mouth daily.         .  Ibuprofen (ADVIL PO)  Take 400-600 mg by mouth daily as needed (pain).             No current facility-administered medications for this visit.      ROS:  Pertinent items  are noted in HPI.  SOCIAL HISTORY:  Married.   PHYSICAL EXAMINATION:    BP 120/74  Pulse 60  Temp(Src) 98 F (36.7 C) (Oral)  Ht 5\' 3"  (1.6 m)  Wt 183 lb (83.008 kg)  BMI 32.43 kg/m2    Wt Readings from Last 3 Encounters:   04/04/13  183 lb (83.008 kg)   02/22/13  189 lb (85.73 kg)   02/07/13  185 lb (83.915 kg)       Ht Readings from Last 3 Encounters:   04/04/13  5\' 3"  (1.6 m)   02/22/13  5\' 3"  (1.6 m)   02/07/13  5\' 3"  (1.6 m)     General appearance: alert, cooperative and appears stated age Head: Normocephalic, without obvious abnormality, atraumatic Neck: no adenopathy, supple, symmetrical, trachea midline and thyroid not enlarged, symmetric, no tenderness/mass/nodules Lungs: clear to auscultation bilaterally Breasts: Inspection negative, No nipple retraction or dimpling, No nipple discharge or bleeding, No axillary or supraclavicular adenopathy, Normal to palpation without dominant masses Heart: regular rate and rhythm Abdomen: soft, non-tender; no masses,  no organomegaly Extremities: extremities normal, atraumatic, no cyanosis or edema Skin: Skin color, texture, turgor normal. No rashes or lesions Lymph nodes: Cervical, supraclavicular, and axillary nodes normal. No abnormal inguinal nodes palpated Neurologic:  Grossly normal  Pelvic: External genitalia:  no lesions              Urethra:  normal appearing urethra with no masses, tenderness or lesions              Bartholins and Skenes: normal                  Vagina: normal appearing vagina with normal color and discharge, no lesions              Cervix: normal appearance                       Bimanual Exam:  Uterus:  uterus is normal size, shape, consistency and nontender                                      Adnexa: normal adnexa in size, nontender and no masses                                      Rectovaginal: Confirms                                      Anus:  normal sphincter tone, no lesions  ASSESSMENT    Perimenopausal female.   Endometrial polyp.   Previous Aygestin therapy.    PLAN  Proceed with hysteroscopic polypectomy with dilation and curettage. Risks, benefits, and alternatives discussed. Risks include but are not limited to bleeding, infection, damage to surrounding organs, uterine perforation, DVT, PE, death, reaction to anesthesia, and pulmonary edema. Patient wishes to proceed.       An After Visit Summary was printed and given to the patient.                Revision History       Date/Time User Action    > 04/04/2013  8:31 AM Melony Overly, MD Sign  04/04/2013  8:04 AM Alphonsa Overall, CMA Sign at close encounter               Encounter-Level Documents:    Electronic signature on 04/04/2013 7:17 AM

## 2013-04-16 NOTE — Anesthesia Preprocedure Evaluation (Signed)
Anesthesia Evaluation  Patient identified by MRN, date of birth, ID band Patient awake    Reviewed: Allergy & Precautions, H&P , Patient's Chart, lab work & pertinent test results, reviewed documented beta blocker date and time   History of Anesthesia Complications Negative for: history of anesthetic complications  Airway Mallampati: II TM Distance: >3 FB Neck ROM: full    Dental no notable dental hx.    Pulmonary neg pulmonary ROS,  breath sounds clear to auscultation  Pulmonary exam normal       Cardiovascular Exercise Tolerance: Good hypertension, negative cardio ROS  Rhythm:regular Rate:Normal     Neuro/Psych negative neurological ROS  negative psych ROS   GI/Hepatic negative GI ROS, Neg liver ROS,   Endo/Other  negative endocrine ROS  Renal/GU negative Renal ROS     Musculoskeletal   Abdominal   Peds  Hematology negative hematology ROS (+)   Anesthesia Other Findings   Reproductive/Obstetrics negative OB ROS                           Anesthesia Physical Anesthesia Plan  ASA: II  Anesthesia Plan: General LMA   Post-op Pain Management:    Induction:   Airway Management Planned:   Additional Equipment:   Intra-op Plan:   Post-operative Plan:   Informed Consent: I have reviewed the patients History and Physical, chart, labs and discussed the procedure including the risks, benefits and alternatives for the proposed anesthesia with the patient or authorized representative who has indicated his/her understanding and acceptance.   Dental Advisory Given  Plan Discussed with: CRNA, Surgeon and Anesthesiologist  Anesthesia Plan Comments:         Anesthesia Quick Evaluation

## 2013-04-16 NOTE — Preoperative (Signed)
Beta Blockers   Reason not to administer Beta Blockers:Patient took her B/P meds this am

## 2013-04-16 NOTE — Progress Notes (Signed)
Update to History and Physical  States she started bleeding vaginally 4 days ago.  No other marked change in status.  Patient examined.   OK to proceed with surgery.

## 2013-04-17 ENCOUNTER — Encounter (HOSPITAL_COMMUNITY): Payer: Self-pay | Admitting: Obstetrics and Gynecology

## 2013-04-17 NOTE — Op Note (Signed)
NAMEMarland Christensen  PAULETTE, ROCKFORD NO.:  [id number]  MEDICAL RECORD NO.:  [id number]  LOCATION:  [facility]                          FACILITY:  [facility]  PHYSICIAN:  Randye Lobo, M.D.   DATE OF BIRTH:  [birth year]  DATE OF PROCEDURE:  04/16/2013 DATE OF DISCHARGE:  04/16/2013                              OPERATIVE REPORT   PREOPERATIVE DIAGNOSES:  Thickened endometrium, endometrial polyp.  POSTOPERATIVE DIAGNOSES:  Thickened endometrium.  PROCEDURE:  Hysteroscopy with dilation and curettage.  SURGEON:  Randye Lobo, M.D.  ANESTHESIA:  LMA, paracervical block with 10 mL 1% lidocaine.  IV FLUIDS:  1500 mL Ringer lactate.  EBL:  5 mL.  URINE OUTPUT:  50 mL, 1.5% glycine deficit: 75 mL.  COMPLICATIONS:  None.  INDICATIONS FOR THE PROCEDURE:  The patient is a 52 year old, gravida 2, para 2, Caucasian female, who presents for evaluation of a 2nd cystic endometrium and a possible endometrial polyp.  The patient has a history of a prior thickened endometrium in May, 2013 at an outside office.  She underwent an endometrial biopsy at that time showing disordered proliferative endometrium, which prompted daily Aygestin 5 mg.  The patient stopped this medication in June, 2014.  She did not experience any withdrawal bleed.  Her FSH was measured at 34 and her estradiol at 108.7 on February 04, 2013.  The patient did subsequently undergo an office pelvic ultrasound showing an endometrial stripe of 11 mm with cystic change.  An endometrial biopsy performed on the same date documented disorder proliferative endometrium suggestive of a polyp. Since that time, the patient has had episodes of vaginal bleeding.  The plan is made to proceed with a hysteroscopy with dilation and curettage and polypectomy after risks, benefits, and alternatives have been reviewed.  FINDINGS:  Examination under anesthesia revealed a small anteverted mobile uterus.  No adnexal masses were  appreciated.  Hysteroscopy demonstrated a clot within the endometrial cavity along with some fluffy and detached endometrial tissue.  SPECIMEN:  Endometrial curettings were sent to pathology.  PROCEDURE:  The patient was re-identified in the preoperative hold area. She received PAS stockings and TED hose for DVT prophylaxis.  In the operating room, the patient was placed in the supine position, and an LMA anesthetic was introduced.  The patient was then placed in the dorsal lithotomy position.  The lower abdomen, vagina, and perineum were then sterilely prepped and draped, and the bladder was catheterized of urine with a red rubber catheter.  The patient was sterilely draped.  An exam under anesthesia was performed.  A weighted speculum was placed inside the vagina, and a single-tooth tenaculum was placed on the anterior cervical lip.  The uterus was sounded to 8 cm.  The cervix was then dilated to a #21 Pratt dilator.  The diagnostic hysteroscope was inserted into the uterine cavity under the continuous infusion of 1.5% glycine.  The findings are as noted above.  The hysteroscope was withdrawn and the cervix was further dilated to a #25 Pratt dilator.  A serrated curette was then used to curette all 4 quadrants of the endometrium until it had a gritty texture to it.  The endometrial curettings were sent to  pathology.  The tenaculum was removed from the anterior cervical lip.  There was bleeding.  This responded to compression with a ring forceps to the anterior cervix.  Hemostasis was then good and all of the vaginal instruments were removed.  The patient was cleansed of Betadine.  She was awakened and escorted to the recovery room in stable condition.  There were no complications to the procedure.  All needle, instrument, and sponge counts were correct.     Randye Lobo, M.D.     BES/MEDQ  D:  04/16/2013  T:  04/17/2013  Job:  [id number]

## 2013-04-25 ENCOUNTER — Encounter: Payer: Self-pay | Admitting: Obstetrics and Gynecology

## 2013-04-25 ENCOUNTER — Other Ambulatory Visit: Payer: Self-pay

## 2013-05-02 ENCOUNTER — Encounter: Payer: Self-pay | Admitting: Obstetrics and Gynecology

## 2013-05-02 ENCOUNTER — Ambulatory Visit (INDEPENDENT_AMBULATORY_CARE_PROVIDER_SITE_OTHER): Payer: BC Managed Care – PPO | Admitting: Obstetrics and Gynecology

## 2013-05-02 VITALS — BP 130/84 | HR 60 | Ht 63.0 in | Wt 183.5 lb

## 2013-05-02 DIAGNOSIS — Z9889 Other specified postprocedural states: Secondary | ICD-10-CM

## 2013-05-02 NOTE — Patient Instructions (Signed)
Keep a bleeding calendar.  Call if you develop heavy, prolonged, or frequent vaginal bleeding.

## 2013-05-02 NOTE — Progress Notes (Signed)
Patient ID: Cheyenne Christensen, female   DOB: 07-May-1961, 52 y.o.   MRN: [id number]  Subjective  Patient is here for a post op check. Status post hysteroscopy with dilation and curettage on 04/16/13. No significant discomfort following surgery. Very little spotting.   No hot flashes or night sweats.   Objective  Pelvic - normal external genitalia and urethral. Cervix - no lesions.  Small and nontender uterus. No adnexal masses or tenderness.  Pathology - benign endometrial polyp.  Assessment  Status post hysteroscopic polypectomy with dilation and curettage. Perimenopausal female.  Plan  Observation of bleeding/menses. Patient will keep bleeding calendar.  If develops prolonged or frequent bleeding, will consider restarting Aygestin 5 mg daily. Follow up prn.   An after visit summary was provided to the patient.

## 2013-09-03 ENCOUNTER — Encounter: Payer: Self-pay | Admitting: Obstetrics and Gynecology

## 2013-10-02 ENCOUNTER — Encounter: Payer: Self-pay | Admitting: Obstetrics and Gynecology

## 2013-12-12 ENCOUNTER — Other Ambulatory Visit: Payer: Self-pay

## 2013-12-12 DIAGNOSIS — Z1231 Encounter for screening mammogram for malignant neoplasm of breast: Secondary | ICD-10-CM

## 2013-12-23 ENCOUNTER — Ambulatory Visit: Payer: BC Managed Care – PPO

## 2013-12-24 ENCOUNTER — Other Ambulatory Visit: Payer: Self-pay | Admitting: Obstetrics and Gynecology

## 2013-12-24 DIAGNOSIS — Z1231 Encounter for screening mammogram for malignant neoplasm of breast: Secondary | ICD-10-CM

## 2013-12-27 ENCOUNTER — Ambulatory Visit: Payer: BC Managed Care – PPO

## 2013-12-27 ENCOUNTER — Ambulatory Visit (HOSPITAL_COMMUNITY): Payer: BC Managed Care – PPO

## 2014-01-01 ENCOUNTER — Ambulatory Visit (HOSPITAL_COMMUNITY): Payer: BC Managed Care – PPO

## 2014-01-02 ENCOUNTER — Ambulatory Visit (HOSPITAL_COMMUNITY)
Admission: RE | Admit: 2014-01-02 | Discharge: 2014-01-02 | Disposition: A | Discharge status: Home / Self Care | Payer: BC Managed Care – PPO | Source: Ambulatory Visit | Attending: Obstetrics and Gynecology | Admitting: Obstetrics and Gynecology

## 2014-01-02 DIAGNOSIS — Z1231 Encounter for screening mammogram for malignant neoplasm of breast: Secondary | ICD-10-CM

## 2014-01-03 ENCOUNTER — Ambulatory Visit: Payer: BC Managed Care – PPO | Admitting: Obstetrics and Gynecology

## 2014-01-09 ENCOUNTER — Ambulatory Visit (INDEPENDENT_AMBULATORY_CARE_PROVIDER_SITE_OTHER): Payer: BC Managed Care – PPO | Admitting: Obstetrics and Gynecology

## 2014-01-09 ENCOUNTER — Encounter: Payer: Self-pay | Admitting: Obstetrics and Gynecology

## 2014-01-09 VITALS — BP 110/64 | HR 60 | Resp 16 | Ht 63.0 in | Wt 179.6 lb

## 2014-01-09 DIAGNOSIS — Z01419 Encounter for gynecological examination (general) (routine) without abnormal findings: Secondary | ICD-10-CM

## 2014-01-09 DIAGNOSIS — Z0000 Encounter for general adult medical examination without abnormal findings: Secondary | ICD-10-CM

## 2014-01-09 LAB — POCT URINALYSIS DIPSTICK
Bilirubin, UA: NEGATIVE
Glucose, UA: NEGATIVE
Ketones, UA: NEGATIVE
Leukocytes, UA: NEGATIVE
Nitrite, UA: NEGATIVE
PH UA: 5
PROTEIN UA: NEGATIVE
RBC UA: NEGATIVE
Urobilinogen, UA: NEGATIVE

## 2014-01-09 NOTE — Progress Notes (Signed)
Patient ID: Cheyenne Christensen, female   DOB: May 25, 1961, 53 y.o.   MRN: [id number] GYNECOLOGY VISIT  PCP:  Sinda Du, MD  Referring provider:   HPI: 53 y.o.   Married  Caucasian  female   G2P2002 with LMP November 22, 2013. here for   AEX. [place] 36 on  02/04/13. Status post D&C in October 2014 - benign polyp Bled for 6 days in February 2015. 6 weeks later bled in March for  5 - 6 days.  No bleeding in April or May. No bleeding in July. Some hot flashes - tolerable.  Declines HRT.  Vit D low at 24.7 and has started Vit D 1000 IU daily.  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  Feeling tired.  Wonders if it is due to her blood pressure medication. Had normal lipid profile, CBC, CMP. No TSH done.  Mother with myeloma and doing chemo for this.   Hgb:    13.6 on 11-27-13 with PCP Urine:  Neg  GYNECOLOGIC HISTORY: Patient's last menstrual period was 08/18/2013. Sexually active: yes  Partner preference: female Contraception:   vasectomy Menopausal hormone therapy: n/a DES exposure:   no Blood transfusions: no   Sexually transmitted diseases:   no GYN procedures and prior surgeries:  D &C/hysteroscopy  03/2013 - benign endometrial polyp, C-section 1987 Last mammogram:  01-02-14 wnl:[facility]               Last pap and high risk HPV testing: 10/2011 wnl:neg HR HPV   History of abnormal pap smear:  no   OB History   Grav Para Term Preterm Abortions TAB SAB Ect Mult Living   2 2 2       2        LIFESTYLE: Exercise:     no          Tobacco: no Alcohol:no Drug use:  no  OTHER HEALTH MAINTENANCE: Tetanus/TDap:  2014 Gardisil:              n/a Influenza:      03/2013 Zostavax:       n/a  Bone density:    n/a Colonoscopy:    Never - was scheduled for last September with Dr. Olevia Perches due to the D & C.  Cancelled and needs to reschedule.  Cholesterol check:  Normal 11/2013  Family History  Problem Relation Age of Onset  . Hypertension Mother   . Deep vein  thrombosis Mother   . Thyroid disease Mother   . Osteoporosis Mother   . Depression Mother   . Anxiety disorder Mother   . Multiple myeloma Mother   . Thyroid disease Sister   . Anxiety disorder Sister   . Depression Sister     There are no active problems to display for this patient.  Past Medical History  Diagnosis Date  . Hypertension   . SVD (spontaneous vaginal delivery)     x 1    Past Surgical History  Procedure Laterality Date  . Cesarean section  1987  . Dilatation & currettage/hysteroscopy with resectocope N/A 04/16/2013    Procedure: DILATATION & CURETTAGE/HYSTEROSCOPY;  Surgeon: Arloa Koh, MD;  Location: [facility];  Service: Gynecology;  Laterality: N/A;    ALLERGIES: Review of patient's allergies indicates no known allergies.  Current Outpatient Prescriptions  Medication Sig Dispense Refill  . acetaminophen (TYLENOL) 500 MG tablet Take 1,000 mg by mouth daily as needed for pain.      . cholecalciferol (VITAMIN  D) 1000 UNITS tablet Take 1,000 Units by mouth daily.      . Ibuprofen (ADVIL PO) Take 400-600 mg by mouth daily as needed (pain).       Marland Kitchen lisinopril (PRINIVIL,ZESTRIL) 10 MG tablet Take 10 mg by mouth daily.       No current facility-administered medications for this visit.     ROS:  Pertinent items are noted in HPI.  SOCIAL HISTORY:  Glass blower/designer.   PHYSICAL EXAMINATION:    BP 110/64  Pulse 60  Resp 16  Ht 5' 3"  (1.6 m)  Wt 179 lb 9.6 oz (81.466 kg)  BMI 31.82 kg/m2  LMP 08/18/2013   Wt Readings from Last 3 Encounters:  01/09/14 179 lb 9.6 oz (81.466 kg)  05/02/13 183 lb 8 oz (83.235 kg)  04/11/13 182 lb (82.555 kg)     Ht Readings from Last 3 Encounters:  01/09/14 5' 3"  (1.6 m)  05/02/13 5' 3"  (1.6 m)  04/11/13 5' 3"  (1.6 m)    General appearance: alert, cooperative and appears stated age Head: Normocephalic, without obvious abnormality, atraumatic Neck: no adenopathy, supple, symmetrical, trachea midline and thyroid not  enlarged, symmetric, no tenderness/mass/nodules Lungs: clear to auscultation bilaterally Breasts: Inspection negative, No nipple retraction or dimpling, No nipple discharge or bleeding, No axillary or supraclavicular adenopathy, Normal to palpation without dominant masses Heart: regular rate and rhythm Abdomen: soft, non-tender; no masses,  no organomegaly Extremities: extremities normal, atraumatic, no cyanosis or edema Skin: Skin color, texture, turgor normal. No rashes.  Three darkly pigmented nevi of the abdominal skin -  2 - 4 mm, flat.  Lymph nodes: Cervical, supraclavicular, and axillary nodes normal. No abnormal inguinal nodes palpated Neurologic: Grossly normal  Pelvic: External genitalia:  no lesions              Urethra:  normal appearing urethra with no masses, tenderness or lesions              Bartholins and Skenes: normal                 Vagina: normal appearing vagina with normal color and discharge, no lesions              Cervix: normal appearance              Pap and high risk HPV testing done: No.        Bimanual Exam:  Uterus:  uterus is normal size, shape, consistency and nontender                                      Adnexa: normal adnexa in size, nontender and no masses                                      Rectovaginal: Confirms                                      Anus:  normal sphincter tone, no lesions  ASSESSMENT  Normal gynecologic exam. Fatigue.  Perimenopausal female.  Low Vit D level.  On OTC replacement. Pigmented nevi.   PLAN  Mammogram recommended yearly.  Pap smear and high risk HPV testing not indicated. Keep bleeding calendar.  Counseled on self  breast exam, Calcium and vitamin D intake, exercise. Patient will check her TSH at her work place and send it in for review. Patient wiill call Dr. Olevia Perches to schedule colonoscopy.  Patient will call Dr. Elige Ko to schedule a skin check. (Established.) Return annually or prn   An After Visit  Summary was printed and given to the patient.

## 2014-01-09 NOTE — Patient Instructions (Signed)

## 2014-04-21 ENCOUNTER — Encounter: Payer: Self-pay | Admitting: Obstetrics and Gynecology

## 2014-04-30 ENCOUNTER — Encounter: Payer: Self-pay | Admitting: Obstetrics and Gynecology

## 2014-05-01 ENCOUNTER — Telehealth: Payer: Self-pay

## 2014-05-01 NOTE — Telephone Encounter (Signed)
Pt states she got an email from Dr. Quincy Simmonds wanting her to make an appt with her today. Pt wants to talk to a nurse.

## 2014-05-01 NOTE — Telephone Encounter (Signed)
Spoke with patient. She has been spotting since 04/08/14 and increase in bleeding since 04/25/14. Patient reports she is using super plus tampons and changing tampon q hour. Patient reports that she is having nickel to quarter sized clots intermittently. Patient denies complaints, no abdominal pain, fevers, sob, chest pain, weakness or fatigue. Patient declines office visit today, she has to take her Mother to the doctors and unsure about schedule.  Patient really would like to be seen tomorrow if possible.   Reviewed with Dr. Quincy Simmonds, okay to schedule appointment tomorrow at 0800 and advise patient to call back or seek immediate medical care if bleeding worsens or if becomes symptomatic with sob, chest pain, fatigued, lightheaded, or weakness.  Spoke with patient and scheduled appointment and instructions given for bleeding emergencies. Patient verbalized understanding of bleeding emergencies and will follow up as scheduled or earlier if needed.   Routing to Dr. Quincy Simmonds to review.

## 2014-05-01 NOTE — Telephone Encounter (Signed)
Appt is scheduled for tomorrow 0800. Encounter closed.

## 2014-05-01 NOTE — Telephone Encounter (Signed)
I agree with the recommendations and will see the patient tomorrow at 8 am.

## 2014-05-02 ENCOUNTER — Encounter: Payer: Self-pay | Admitting: Obstetrics and Gynecology

## 2014-05-02 ENCOUNTER — Ambulatory Visit: Payer: BC Managed Care – PPO | Admitting: Obstetrics and Gynecology

## 2014-05-02 NOTE — Telephone Encounter (Signed)
Patient also called and left a message on the after hours answering machine. I called the patient and she wants Dr. Quincy Simmonds to know she is feeling better as the bleeding has decreased. She does not think she needs to reschedule at this time due to "feeling better" so she will call back if necessary to reschedule.

## 2015-02-27 ENCOUNTER — Other Ambulatory Visit: Payer: Self-pay | Admitting: Obstetrics and Gynecology

## 2015-02-27 DIAGNOSIS — Z1231 Encounter for screening mammogram for malignant neoplasm of breast: Secondary | ICD-10-CM

## 2015-03-06 ENCOUNTER — Ambulatory Visit (HOSPITAL_COMMUNITY)
Admission: RE | Admit: 2015-03-06 | Discharge: 2015-03-06 | Disposition: A | Discharge status: Home / Self Care | Payer: BLUE CROSS/BLUE SHIELD | Source: Ambulatory Visit | Attending: Obstetrics and Gynecology | Admitting: Obstetrics and Gynecology

## 2015-03-06 DIAGNOSIS — Z1231 Encounter for screening mammogram for malignant neoplasm of breast: Secondary | ICD-10-CM | POA: Insufficient documentation

## 2015-06-09 ENCOUNTER — Ambulatory Visit: Payer: Self-pay | Admitting: Obstetrics and Gynecology

## 2015-07-16 ENCOUNTER — Ambulatory Visit: Payer: Self-pay | Admitting: Obstetrics and Gynecology

## 2015-07-27 ENCOUNTER — Other Ambulatory Visit (HOSPITAL_COMMUNITY): Payer: Self-pay | Admitting: Pulmonary Disease

## 2015-07-27 ENCOUNTER — Ambulatory Visit (HOSPITAL_COMMUNITY)
Admission: RE | Admit: 2015-07-27 | Discharge: 2015-07-27 | Disposition: A | Discharge status: Home / Self Care | Payer: Commercial Managed Care - HMO | Source: Ambulatory Visit | Attending: Pulmonary Disease | Admitting: Pulmonary Disease

## 2015-07-27 DIAGNOSIS — M25551 Pain in right hip: Secondary | ICD-10-CM | POA: Diagnosis not present

## 2015-07-27 DIAGNOSIS — M47896 Other spondylosis, lumbar region: Secondary | ICD-10-CM | POA: Diagnosis not present

## 2015-07-27 DIAGNOSIS — M545 Low back pain: Secondary | ICD-10-CM

## 2015-08-21 ENCOUNTER — Encounter (INDEPENDENT_AMBULATORY_CARE_PROVIDER_SITE_OTHER): Payer: Self-pay | Admitting: *Deleted

## 2015-09-01 ENCOUNTER — Telehealth (INDEPENDENT_AMBULATORY_CARE_PROVIDER_SITE_OTHER): Payer: Self-pay | Admitting: *Deleted

## 2015-09-01 NOTE — Telephone Encounter (Signed)
I was going to send this to Maia Breslow, has sent a letter to the patient. Awaiting the patient to call our office.

## 2015-12-21 ENCOUNTER — Ambulatory Visit: Payer: Self-pay | Admitting: Obstetrics and Gynecology

## 2015-12-28 ENCOUNTER — Ambulatory Visit (INDEPENDENT_AMBULATORY_CARE_PROVIDER_SITE_OTHER): Payer: 59 | Admitting: Obstetrics and Gynecology

## 2015-12-28 ENCOUNTER — Encounter: Payer: Self-pay | Admitting: Obstetrics and Gynecology

## 2015-12-28 VITALS — BP 110/72 | HR 64 | Resp 16 | Ht 62.75 in | Wt 187.2 lb

## 2015-12-28 DIAGNOSIS — Z01419 Encounter for gynecological examination (general) (routine) without abnormal findings: Secondary | ICD-10-CM

## 2015-12-28 NOTE — Patient Instructions (Signed)
Health Maintenance, Female Adopting a healthy lifestyle and getting preventive care can go a long way to promote health and wellness. Talk with your health care provider about what schedule of regular examinations is right for you. This is a good chance for you to check in with your provider about disease prevention and staying healthy. In between checkups, there are plenty of things you can do on your own. Experts have done a lot of research about which lifestyle changes and preventive measures are most likely to keep you healthy. Ask your health care provider for more information. WEIGHT AND DIET  Eat a healthy diet  Be sure to include plenty of vegetables, fruits, low-fat dairy products, and lean protein.  Do not eat a lot of foods high in solid fats, added sugars, or salt.  Get regular exercise. This is one of the most important things you can do for your health.  Most adults should exercise for at least 150 minutes each week. The exercise should increase your heart rate and make you sweat (moderate-intensity exercise).  Most adults should also do strengthening exercises at least twice a week. This is in addition to the moderate-intensity exercise.  Maintain a healthy weight  Body mass index (BMI) is a measurement that can be used to identify possible weight problems. It estimates body fat based on height and weight. Your health care provider can help determine your BMI and help you achieve or maintain a healthy weight.  For females 20 years of age and older:   A BMI below 18.5 is considered underweight.  A BMI of 18.5 to 24.9 is normal.  A BMI of 25 to 29.9 is considered overweight.  A BMI of 30 and above is considered obese.  Watch levels of cholesterol and blood lipids  You should start having your blood tested for lipids and cholesterol at 53 years of age, then have this test every 5 years.  You may need to have your cholesterol levels checked more often if:  Your lipid  or cholesterol levels are high.  You are older than 53 years of age.  You are at high risk for heart disease.  CANCER SCREENING   Lung Cancer  Lung cancer screening is recommended for adults 55-80 years old who are at high risk for lung cancer because of a history of smoking.  A yearly low-dose CT scan of the lungs is recommended for people who:  Currently smoke.  Have quit within the past 15 years.  Have at least a 30-pack-year history of smoking. A pack year is smoking an average of one pack of cigarettes a day for 1 year.  Yearly screening should continue until it has been 15 years since you quit.  Yearly screening should stop if you develop a health problem that would prevent you from having lung cancer treatment.  Breast Cancer  Practice breast self-awareness. This means understanding how your breasts normally appear and feel.  It also means doing regular breast self-exams. Let your health care provider know about any changes, no matter how small.  If you are in your 20s or 30s, you should have a clinical breast exam (CBE) by a health care provider every 1-3 years as part of a regular health exam.  If you are 40 or older, have a CBE every year. Also consider having a breast X-ray (mammogram) every year.  If you have a family history of breast cancer, talk to your health care provider about genetic screening.  If you   are at high risk for breast cancer, talk to your health care provider about having an MRI and a mammogram every year.  Breast cancer gene (BRCA) assessment is recommended for women who have family members with BRCA-related cancers. BRCA-related cancers include:  Breast.  Ovarian.  Tubal.  Peritoneal cancers.  Results of the assessment will determine the need for genetic counseling and BRCA1 and BRCA2 testing. Cervical Cancer Your health care provider may recommend that you be screened regularly for cancer of the pelvic organs (ovaries, uterus, and  vagina). This screening involves a pelvic examination, including checking for microscopic changes to the surface of your cervix (Pap test). You may be encouraged to have this screening done every 3 years, beginning at age 21.  For women ages 30-65, health care providers may recommend pelvic exams and Pap testing every 3 years, or they may recommend the Pap and pelvic exam, combined with testing for human papilloma virus (HPV), every 5 years. Some types of HPV increase your risk of cervical cancer. Testing for HPV may also be done on women of any age with unclear Pap test results.  Other health care providers may not recommend any screening for nonpregnant women who are considered low risk for pelvic cancer and who do not have symptoms. Ask your health care provider if a screening pelvic exam is right for you.  If you have had past treatment for cervical cancer or a condition that could lead to cancer, you need Pap tests and screening for cancer for at least 20 years after your treatment. If Pap tests have been discontinued, your risk factors (such as having a new sexual partner) need to be reassessed to determine if screening should resume. Some women have medical problems that increase the chance of getting cervical cancer. In these cases, your health care provider may recommend more frequent screening and Pap tests. Colorectal Cancer  This type of cancer can be detected and often prevented.  Routine colorectal cancer screening usually begins at 53 years of age and continues through 53 years of age.  Your health care provider may recommend screening at an earlier age if you have risk factors for colon cancer.  Your health care provider may also recommend using home test kits to check for hidden blood in the stool.  A small camera at the end of a tube can be used to examine your colon directly (sigmoidoscopy or colonoscopy). This is done to check for the earliest forms of colorectal  cancer.  Routine screening usually begins at age 50.  Direct examination of the colon should be repeated every 5-10 years through 53 years of age. However, you may need to be screened more often if early forms of precancerous polyps or small growths are found. Skin Cancer  Check your skin from head to toe regularly.  Tell your health care provider about any new moles or changes in moles, especially if there is a change in a mole's shape or color.  Also tell your health care provider if you have a mole that is larger than the size of a pencil eraser.  Always use sunscreen. Apply sunscreen liberally and repeatedly throughout the day.  Protect yourself by wearing long sleeves, pants, a wide-brimmed hat, and sunglasses whenever you are outside. HEART DISEASE, DIABETES, AND HIGH BLOOD PRESSURE   High blood pressure causes heart disease and increases the risk of stroke. High blood pressure is more likely to develop in:  People who have blood pressure in the high end   of the normal range (130-139/85-89 mm Hg).  People who are overweight or obese.  People who are African American.  If you are 38-23 years of age, have your blood pressure checked every 3-5 years. If you are 61 years of age or older, have your blood pressure checked every year. You should have your blood pressure measured twice--once when you are at a hospital or clinic, and once when you are not at a hospital or clinic. Record the average of the two measurements. To check your blood pressure when you are not at a hospital or clinic, you can use:  An automated blood pressure machine at a pharmacy.  A home blood pressure monitor.  If you are between 45 years and 39 years old, ask your health care provider if you should take aspirin to prevent strokes.  Have regular diabetes screenings. This involves taking a blood sample to check your fasting blood sugar level.  If you are at a normal weight and have a low risk for diabetes,  have this test once every three years after 53 years of age.  If you are overweight and have a high risk for diabetes, consider being tested at a younger age or more often. PREVENTING INFECTION  Hepatitis B  If you have a higher risk for hepatitis B, you should be screened for this virus. You are considered at high risk for hepatitis B if:  You were born in a country where hepatitis B is common. Ask your health care provider which countries are considered high risk.  Your parents were born in a high-risk country, and you have not been immunized against hepatitis B (hepatitis B vaccine).  You have HIV or AIDS.  You use needles to inject street drugs.  You live with someone who has hepatitis B.  You have had sex with someone who has hepatitis B.  You get hemodialysis treatment.  You take certain medicines for conditions, including cancer, organ transplantation, and autoimmune conditions. Hepatitis C  Blood testing is recommended for:  Everyone born from [birth year] through 1963.  Anyone with known risk factors for hepatitis C. Sexually transmitted infections (STIs)  You should be screened for sexually transmitted infections (STIs) including gonorrhea and chlamydia if:  You are sexually active and are younger than 53 years of age.  You are older than 53 years of age and your health care provider tells you that you are at risk for this type of infection.  Your sexual activity has changed since you were last screened and you are at an increased risk for chlamydia or gonorrhea. Ask your health care provider if you are at risk.  If you do not have HIV, but are at risk, it may be recommended that you take a prescription medicine daily to prevent HIV infection. This is called pre-exposure prophylaxis (PrEP). You are considered at risk if:  You are sexually active and do not regularly use condoms or know the HIV status of your partner(s).  You take drugs by injection.  You are sexually  active with a partner who has HIV. Talk with your health care provider about whether you are at high risk of being infected with HIV. If you choose to begin PrEP, you should first be tested for HIV. You should then be tested every 3 months for as long as you are taking PrEP.  PREGNANCY   If you are premenopausal and you may become pregnant, ask your health care provider about preconception counseling.  If you may  become pregnant, take 400 to 800 micrograms (mcg) of folic acid every day.  If you want to prevent pregnancy, talk to your health care provider about birth control (contraception). OSTEOPOROSIS AND MENOPAUSE   Osteoporosis is a disease in which the bones lose minerals and strength with aging. This can result in serious bone fractures. Your risk for osteoporosis can be identified using a bone density scan.  If you are 61 years of age or older, or if you are at risk for osteoporosis and fractures, ask your health care provider if you should be screened.  Ask your health care provider whether you should take a calcium or vitamin D supplement to lower your risk for osteoporosis.  Menopause may have certain physical symptoms and risks.  Hormone replacement therapy may reduce some of these symptoms and risks. Talk to your health care provider about whether hormone replacement therapy is right for you.  HOME CARE INSTRUCTIONS   Schedule regular health, dental, and eye exams.  Stay current with your immunizations.   Do not use any tobacco products including cigarettes, chewing tobacco, or electronic cigarettes.  If you are pregnant, do not drink alcohol.  If you are breastfeeding, limit how much and how often you drink alcohol.  Limit alcohol intake to no more than 1 drink per day for nonpregnant women. One drink equals 12 ounces of beer, 5 ounces of wine, or 1 ounces of hard liquor.  Do not use street drugs.  Do not share needles.  Ask your health care provider for help if  you need support or information about quitting drugs.  Tell your health care provider if you often feel depressed.  Tell your health care provider if you have ever been abused or do not feel safe at home.   This information is not intended to replace advice given to you by your health care provider. Make sure you discuss any questions you have with your health care provider.   Document Released: 12/20/2010 Document Revised: 06/27/2014 Document Reviewed: 05/08/2013 Elsevier Interactive Patient Education [employer].

## 2015-12-28 NOTE — Progress Notes (Signed)
Patient ID: Cheyenne Christensen, female   DOB: 27-Sep-1960, 55 y.o.   MRN: [id number] 55 y.o. G88P2002 Married Caucasian female here for annual exam.    States she has low vit D.  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  No vaginal bleeding. Some hot flashes. This is managable.   No prior hep C.  Will do with employer as it not charged.  Mother died of multipe myeloma.  Feels she is just getting back to taking care of herself. She has a check up coming soon.  PCP:  Sinda Du, MD  Patient's last menstrual period was 08/18/2013.           Sexually active: Yes.   female The current method of family planning is vasectomy.    Exercising: No.   Smoker:  no  Health Maintenance: Pap:  10/2011 Neg:Neg HR HPV History of abnormal Pap:  no MMG:  03-09-15 Density B/Neg/BiRads1:[facility] Colonoscopy:  NEVER--will schedule BMD:   n/a  Result  n/a TDaP:  2014 Gardasil:   N/A  Screening Labs:  Hb today: PCP, Urine today: unable   reports that she has never smoked. She has never used smokeless tobacco. She reports that she does not drink alcohol or use illicit drugs.  Past Medical History  Diagnosis Date  . Hypertension   . SVD (spontaneous vaginal delivery)     x 1    Past Surgical History  Procedure Laterality Date  . Cesarean section  1987  . Dilatation & currettage/hysteroscopy with resectocope N/A 04/16/2013    Procedure: DILATATION & CURETTAGE/HYSTEROSCOPY;  Surgeon: Arloa Koh, MD;  Location: [facility];  Service: Gynecology;  Laterality: N/A;    Current Outpatient Prescriptions  Medication Sig Dispense Refill  . acetaminophen (TYLENOL) 500 MG tablet Take 1,000 mg by mouth daily as needed for pain.    . Cholecalciferol (VITAMIN D3) 2000 units TABS Take 1 tablet by mouth daily.    . Ibuprofen (ADVIL PO) Take 400-600 mg by mouth daily as needed (pain).     Marland Kitchen lisinopril (PRINIVIL,ZESTRIL) 10 MG tablet Take 10 mg by mouth daily.     No current  facility-administered medications for this visit.    Family History  Problem Relation Age of Onset  . Hypertension Mother   . Deep vein thrombosis Mother   . Thyroid disease Mother   . Osteoporosis Mother   . Depression Mother   . Anxiety disorder Mother   . Multiple myeloma Mother     Dec multiple myeloma age 12  . Thyroid disease Sister   . Anxiety disorder Sister   . Depression Sister     ROS:  Pertinent items are noted in HPI.  Otherwise, a comprehensive ROS was negative.  Exam:   BP 110/72 mmHg  Pulse 64  Resp 16  Ht 5' 2.75" (1.594 m)  Wt 187 lb 3.2 oz (84.913 kg)  BMI 33.42 kg/m2  LMP 08/18/2013    General appearance: alert, cooperative and appears stated age Head: Normocephalic, without obvious abnormality, atraumatic Neck: no adenopathy, supple, symmetrical, trachea midline and thyroid normal to inspection and palpation Lungs: clear to auscultation bilaterally Breasts: normal appearance, no masses or tenderness, Inspection negative, No nipple retraction or dimpling, No nipple discharge or bleeding, No axillary or supraclavicular adenopathy Heart: regular rate and rhythm Abdomen: incisions:  Yes.    [place] , soft, non-tender; no masses, no organomegaly Extremities: extremities normal, atraumatic, no cyanosis or edema Skin: Skin color, texture, turgor normal.  No rashes or lesions Lymph nodes: Cervical, supraclavicular, and axillary nodes normal. No abnormal inguinal nodes palpated Neurologic: Grossly normal  Pelvic: External genitalia:  no lesions              Urethra:  normal appearing urethra with no masses, tenderness or lesions              Bartholins and Skenes: normal                 Vagina: normal appearing vagina with normal color and discharge, no lesions              Cervix: no lesions              Pap taken: Yes.   Bimanual Exam:  Uterus:  normal size, contour, position, consistency, mobility, non-tender              Adnexa: normal adnexa and no  mass, fullness, tenderness              Rectal exam: Yes.  .  Confirms.              Anus:  normal sphincter tone, no lesions  Chaperone was present for exam.  Assessment:   Well woman visit with normal exam. Bereavement.   Plan: Yearly mammogram recommended after age 14.  Recommended self breast exam.  Pap and HR HPV as above. Discussed Calcium, Vitamin D, regular exercise program including cardiovascular and weight bearing exercise. Labs performed.  No..   See orders.  Will do Hep C at work. Prescription medication(s) given.  No..  I discussed [place] for hot flashes. She will schedule her colonoscopy.  Already has a name and a referral.  Brochure for [place] group for bereavement counseling.  Support given.  Follow up annually and prn.       After visit summary provided.

## 2015-12-30 LAB — IPS PAP TEST WITH HPV

## 2016-05-02 ENCOUNTER — Other Ambulatory Visit: Payer: Self-pay | Admitting: Obstetrics and Gynecology

## 2016-05-02 DIAGNOSIS — Z1231 Encounter for screening mammogram for malignant neoplasm of breast: Secondary | ICD-10-CM

## 2016-05-19 ENCOUNTER — Ambulatory Visit (HOSPITAL_COMMUNITY)
Admission: RE | Admit: 2016-05-19 | Discharge: 2016-05-19 | Disposition: A | Discharge status: Home / Self Care | Payer: 59 | Source: Ambulatory Visit | Attending: Obstetrics and Gynecology | Admitting: Obstetrics and Gynecology

## 2016-05-19 DIAGNOSIS — Z1231 Encounter for screening mammogram for malignant neoplasm of breast: Secondary | ICD-10-CM | POA: Insufficient documentation

## 2016-08-02 DIAGNOSIS — E559 Vitamin D deficiency, unspecified: Secondary | ICD-10-CM | POA: Diagnosis not present

## 2016-08-02 DIAGNOSIS — Z79899 Other long term (current) drug therapy: Secondary | ICD-10-CM | POA: Diagnosis not present

## 2016-08-02 DIAGNOSIS — I10 Essential (primary) hypertension: Secondary | ICD-10-CM | POA: Diagnosis not present

## 2017-01-09 ENCOUNTER — Telehealth: Payer: Self-pay | Admitting: Obstetrics and Gynecology

## 2017-01-09 NOTE — Telephone Encounter (Signed)
Left message on voicemail to call and reschedule cancelled appointment because of surgery.

## 2017-01-12 ENCOUNTER — Ambulatory Visit: Payer: 59 | Admitting: Obstetrics and Gynecology

## 2017-03-24 ENCOUNTER — Ambulatory Visit: Payer: 59 | Admitting: Obstetrics and Gynecology

## 2017-04-13 DIAGNOSIS — Z23 Encounter for immunization: Secondary | ICD-10-CM | POA: Diagnosis not present

## 2017-04-24 ENCOUNTER — Ambulatory Visit (INDEPENDENT_AMBULATORY_CARE_PROVIDER_SITE_OTHER): Payer: 59 | Admitting: Otolaryngology

## 2017-04-24 DIAGNOSIS — J310 Chronic rhinitis: Secondary | ICD-10-CM | POA: Diagnosis not present

## 2017-04-24 DIAGNOSIS — J342 Deviated nasal septum: Secondary | ICD-10-CM

## 2017-04-24 DIAGNOSIS — R070 Pain in throat: Secondary | ICD-10-CM | POA: Diagnosis not present

## 2017-06-01 ENCOUNTER — Telehealth: Payer: Self-pay | Admitting: Obstetrics and Gynecology

## 2017-06-01 NOTE — Telephone Encounter (Signed)
Left message for patient to reschedule 06/23/17 appt with Dr Quincy Simmonds

## 2017-06-23 ENCOUNTER — Ambulatory Visit: Payer: 59 | Admitting: Obstetrics and Gynecology

## 2017-09-28 ENCOUNTER — Other Ambulatory Visit: Payer: Self-pay | Admitting: Obstetrics and Gynecology

## 2017-09-28 DIAGNOSIS — Z1231 Encounter for screening mammogram for malignant neoplasm of breast: Secondary | ICD-10-CM

## 2017-10-09 ENCOUNTER — Telehealth: Payer: Self-pay | Admitting: Obstetrics and Gynecology

## 2017-10-09 NOTE — Telephone Encounter (Signed)
Left patient a message to call back to reschedule a future appointment that was cancelled by the provider for an AEX. °

## 2017-10-16 ENCOUNTER — Other Ambulatory Visit: Payer: Self-pay | Admitting: Obstetrics and Gynecology

## 2017-10-16 DIAGNOSIS — Z1231 Encounter for screening mammogram for malignant neoplasm of breast: Secondary | ICD-10-CM

## 2017-10-19 ENCOUNTER — Encounter: Payer: Self-pay | Admitting: Obstetrics and Gynecology

## 2017-10-19 ENCOUNTER — Ambulatory Visit: Payer: 59 | Admitting: Obstetrics and Gynecology

## 2017-10-19 ENCOUNTER — Other Ambulatory Visit: Payer: Self-pay

## 2017-10-19 ENCOUNTER — Other Ambulatory Visit (HOSPITAL_COMMUNITY)
Admission: RE | Admit: 2017-10-19 | Discharge: 2017-10-19 | Disposition: A | Discharge status: Home / Self Care | Payer: 59 | Source: Ambulatory Visit | Attending: Obstetrics and Gynecology | Admitting: Obstetrics and Gynecology

## 2017-10-19 VITALS — BP 124/80 | HR 88 | Resp 16 | Ht 63.0 in | Wt 188.0 lb

## 2017-10-19 DIAGNOSIS — Z01419 Encounter for gynecological examination (general) (routine) without abnormal findings: Secondary | ICD-10-CM | POA: Diagnosis not present

## 2017-10-19 NOTE — Patient Instructions (Signed)

## 2017-10-19 NOTE — Progress Notes (Signed)
57 y.o. G32P2002 Married Caucasian female here for annual exam.    No vaginal bleeding or spotting.   Both parents died from cancer.  Sister in law dx with ovarian cancer.   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  A provider there ordered them for her.  PCP: Dr. Velvet Bathe     Patient's last menstrual period was 08/18/2013.           Sexually active: Yes.    The current method of family planning is vasectomy.    Exercising: Yes.    walking Smoker:  no  Health Maintenance: Pap:  12/28/15 pap and HR HPV negative History of abnormal Pap:  no MMG:  05/19/16 BIRADS 1 negative/density b -- scheduled 10/26/17 Colonoscopy:  never BMD:   n/a  Result  n/a TDaP:  2014 Gardasil:   n/a HIV: negative in the past Hep C: never.  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 Screening Labs:  PCP   reports that she has never smoked. She has never used smokeless tobacco. She reports that she does not drink alcohol or use drugs.  Past Medical History:  Diagnosis Date  . Hypertension   . SVD (spontaneous vaginal delivery)    x 1    Past Surgical History:  Procedure Laterality Date  . CESAREAN SECTION  1987  . DILATATION & CURRETTAGE/HYSTEROSCOPY WITH RESECTOCOPE N/A 04/16/2013   Procedure: DILATATION & CURETTAGE/HYSTEROSCOPY;  Surgeon: Arloa Koh, MD;  Location: [facility];  Service: Gynecology;  Laterality: N/A;    Current Outpatient Medications  Medication Sig Dispense Refill  . acetaminophen (TYLENOL) 500 MG tablet Take 1,000 mg by mouth daily as needed for pain.    Marland Kitchen amLODipine (NORVASC) 5 MG tablet Take 1 tablet by mouth daily.  3  . Cholecalciferol (VITAMIN D3) 2000 units TABS Take 1 tablet by mouth daily.    . Ibuprofen (ADVIL PO) Take 400-600 mg by mouth daily as needed (pain).      No current facility-administered medications for this visit.     Family History  Problem Relation Age of Onset  . Hypertension Mother   . Deep vein thrombosis Mother    . Thyroid disease Mother   . Osteoporosis Mother   . Depression Mother   . Anxiety disorder Mother   . Multiple myeloma Mother        Dec multiple myeloma age 11  . Thyroid disease Sister   . Anxiety disorder Sister   . Depression Sister   . Cancer Father        non Hodgkin's lymphoma    Review of Systems  Constitutional: Negative.   HENT: Negative.   Eyes: Negative.   Respiratory: Negative.   Cardiovascular: Negative.   Gastrointestinal: Negative.   Endocrine: Negative.   Genitourinary: Negative.   Musculoskeletal: Negative.   Skin: Negative.   Allergic/Immunologic: Negative.   Neurological: Negative.   Hematological: Negative.   Psychiatric/Behavioral: Negative.     Exam:   BP 124/80 (BP Location: Right Arm, Patient Position: Sitting, Cuff Size: Normal)   Pulse 88   Resp 16   Ht _0  (1.6 m)   Wt 188 lb (85.3 kg)   LMP 08/18/2013   BMI 33.30 kg/m     General appearance: alert, cooperative and appears stated age Head: Normocephalic, without obvious abnormality, atraumatic Neck: no adenopathy, supple, symmetrical, trachea midline and thyroid normal to inspection and palpation Lungs: clear to auscultation bilaterally Breasts:  normal appearance, no masses or tenderness, No nipple retraction or dimpling, No nipple discharge or bleeding, No axillary or supraclavicular adenopathy Heart: regular rate and rhythm Abdomen: soft, non-tender; no masses, no organomegaly Extremities: extremities normal, atraumatic, no cyanosis or edema Skin: Skin color, texture, turgor normal. No rashes or lesions Lymph nodes: Cervical, supraclavicular, and axillary nodes normal. No abnormal inguinal nodes palpated Neurologic: Grossly normal  Pelvic: External genitalia:  no lesions              Urethra:  normal appearing urethra with no masses, tenderness or lesions              Bartholins and Skenes: normal                 Vagina: normal appearing vagina with normal color and discharge,  no lesions              Cervix: no lesions              Pap taken: Yes.   Bimanual Exam:  Uterus:  normal size, contour, position, consistency, mobility, non-tender              Adnexa: no mass, fullness, tenderness              Rectal exam: Yes.  .  Confirms.              Anus:  normal sphincter tone, no lesions  Chaperone was present for exam.  Assessment:   Well woman visit with normal exam.  Plan: Mammogram screening. Recommended self breast awareness. Pap and HR HPV as above. Guidelines for Calcium, Vitamin D, regular exercise program including cardiovascular and weight bearing exercise. She will do colonoscopy with Dr. Laural Golden in [place]. Follow up annually and prn.   After visit summary provided.

## 2017-10-20 ENCOUNTER — Ambulatory Visit: Payer: 59 | Admitting: Obstetrics and Gynecology

## 2017-10-20 LAB — CYTOLOGY - PAP
DIAGNOSIS: NEGATIVE
HPV (WINDOPATH): NOT DETECTED

## 2017-10-23 ENCOUNTER — Ambulatory Visit: Payer: 59

## 2017-10-26 ENCOUNTER — Ambulatory Visit (HOSPITAL_COMMUNITY)
Admission: RE | Admit: 2017-10-26 | Discharge: 2017-10-26 | Disposition: A | Discharge status: Home / Self Care | Payer: 59 | Source: Ambulatory Visit | Attending: Obstetrics and Gynecology | Admitting: Obstetrics and Gynecology

## 2017-10-26 ENCOUNTER — Encounter (HOSPITAL_COMMUNITY): Payer: Self-pay

## 2017-10-26 DIAGNOSIS — Z1231 Encounter for screening mammogram for malignant neoplasm of breast: Secondary | ICD-10-CM | POA: Diagnosis not present

## 2017-11-22 ENCOUNTER — Other Ambulatory Visit (HOSPITAL_COMMUNITY)
Admission: RE | Admit: 2017-11-22 | Discharge: 2017-11-22 | Disposition: A | Discharge status: Home / Self Care | Payer: 59 | Source: Ambulatory Visit | Attending: Nephrology | Admitting: Nephrology

## 2017-11-22 ENCOUNTER — Ambulatory Visit (HOSPITAL_COMMUNITY)
Admission: RE | Admit: 2017-11-22 | Discharge: 2017-11-22 | Disposition: A | Discharge status: Home / Self Care | Payer: 59 | Source: Ambulatory Visit | Attending: Nephrology | Admitting: Nephrology

## 2017-11-22 ENCOUNTER — Other Ambulatory Visit (HOSPITAL_COMMUNITY): Payer: Self-pay | Admitting: Nephrology

## 2017-11-22 DIAGNOSIS — R059 Cough, unspecified: Secondary | ICD-10-CM

## 2017-11-22 DIAGNOSIS — R05 Cough: Secondary | ICD-10-CM

## 2017-11-22 DIAGNOSIS — R6883 Chills (without fever): Secondary | ICD-10-CM | POA: Insufficient documentation

## 2017-11-22 LAB — CBC WITH DIFFERENTIAL/PLATELET
Basophils Absolute: 0 10*3/uL (ref 0.0–0.1)
Basophils Relative: 0 %
Eosinophils Absolute: 0.1 10*3/uL (ref 0.0–0.7)
Eosinophils Relative: 1 %
HEMATOCRIT: 41.8 % (ref 36.0–46.0)
HEMOGLOBIN: 13.4 g/dL (ref 12.0–15.0)
LYMPHS ABS: 1.1 10*3/uL (ref 0.7–4.0)
LYMPHS PCT: 11 %
MCH: 29.2 pg (ref 26.0–34.0)
MCHC: 32.1 g/dL (ref 30.0–36.0)
MCV: 91.1 fL (ref 78.0–100.0)
MONO ABS: 0.4 10*3/uL (ref 0.1–1.0)
MONOS PCT: 4 %
NEUTROS ABS: 8.3 10*3/uL — AB (ref 1.7–7.7)
NEUTROS PCT: 84 %
Platelets: 227 10*3/uL (ref 150–400)
RBC: 4.59 MIL/uL (ref 3.87–5.11)
RDW: 13.6 % (ref 11.5–15.5)
WBC: 9.9 10*3/uL (ref 4.0–10.5)

## 2017-11-22 LAB — RENAL FUNCTION PANEL
ANION GAP: 10 (ref 5–15)
Albumin: 3.9 g/dL (ref 3.5–5.0)
BUN: 16 mg/dL (ref 6–20)
CALCIUM: 8.9 mg/dL (ref 8.9–10.3)
CHLORIDE: 101 mmol/L (ref 101–111)
CO2: 25 mmol/L (ref 22–32)
Creatinine, Ser: 0.83 mg/dL (ref 0.44–1.00)
GFR calc non Af Amer: >60 mL/min (ref >60)
GLUCOSE: 167 mg/dL — AB (ref 65–99)
Phosphorus: 3.6 mg/dL (ref 2.5–4.6)
Potassium: 3.5 mmol/L (ref 3.5–5.1)
SODIUM: 136 mmol/L (ref 135–145)

## 2017-11-25 DIAGNOSIS — J0100 Acute maxillary sinusitis, unspecified: Secondary | ICD-10-CM | POA: Diagnosis not present

## 2017-12-15 DIAGNOSIS — Z79899 Other long term (current) drug therapy: Secondary | ICD-10-CM | POA: Diagnosis not present

## 2017-12-15 DIAGNOSIS — I10 Essential (primary) hypertension: Secondary | ICD-10-CM | POA: Diagnosis not present

## 2017-12-15 DIAGNOSIS — E559 Vitamin D deficiency, unspecified: Secondary | ICD-10-CM | POA: Diagnosis not present

## 2017-12-22 ENCOUNTER — Encounter

## 2017-12-22 ENCOUNTER — Ambulatory Visit: Payer: 59 | Admitting: Obstetrics and Gynecology

## 2017-12-26 DIAGNOSIS — R300 Dysuria: Secondary | ICD-10-CM | POA: Diagnosis not present

## 2018-02-07 DIAGNOSIS — R079 Chest pain, unspecified: Secondary | ICD-10-CM | POA: Diagnosis not present

## 2018-02-07 DIAGNOSIS — N390 Urinary tract infection, site not specified: Secondary | ICD-10-CM | POA: Diagnosis not present

## 2018-02-07 DIAGNOSIS — I10 Essential (primary) hypertension: Secondary | ICD-10-CM | POA: Diagnosis not present

## 2018-02-27 DIAGNOSIS — R300 Dysuria: Secondary | ICD-10-CM | POA: Diagnosis not present

## 2018-04-12 DIAGNOSIS — Z23 Encounter for immunization: Secondary | ICD-10-CM | POA: Diagnosis not present

## 2018-04-17 DIAGNOSIS — R300 Dysuria: Secondary | ICD-10-CM | POA: Diagnosis not present

## 2018-04-17 DIAGNOSIS — I10 Essential (primary) hypertension: Secondary | ICD-10-CM | POA: Diagnosis not present

## 2018-04-17 DIAGNOSIS — Z79899 Other long term (current) drug therapy: Secondary | ICD-10-CM | POA: Diagnosis not present

## 2018-04-23 ENCOUNTER — Encounter (INDEPENDENT_AMBULATORY_CARE_PROVIDER_SITE_OTHER): Payer: Self-pay | Admitting: *Deleted

## 2018-04-23 DIAGNOSIS — N390 Urinary tract infection, site not specified: Secondary | ICD-10-CM | POA: Diagnosis not present

## 2018-04-23 DIAGNOSIS — Z0000 Encounter for general adult medical examination without abnormal findings: Secondary | ICD-10-CM | POA: Diagnosis not present

## 2018-04-23 DIAGNOSIS — I10 Essential (primary) hypertension: Secondary | ICD-10-CM | POA: Diagnosis not present

## 2018-05-03 ENCOUNTER — Other Ambulatory Visit (HOSPITAL_COMMUNITY): Payer: Self-pay | Admitting: Internal Medicine

## 2018-05-03 DIAGNOSIS — Z780 Asymptomatic menopausal state: Secondary | ICD-10-CM

## 2018-05-23 ENCOUNTER — Other Ambulatory Visit (INDEPENDENT_AMBULATORY_CARE_PROVIDER_SITE_OTHER): Payer: Self-pay | Admitting: *Deleted

## 2018-05-23 DIAGNOSIS — Z1211 Encounter for screening for malignant neoplasm of colon: Secondary | ICD-10-CM

## 2018-05-28 ENCOUNTER — Other Ambulatory Visit (HOSPITAL_COMMUNITY): Payer: 59

## 2018-06-01 ENCOUNTER — Inpatient Hospital Stay (HOSPITAL_COMMUNITY): Admission: RE | Admit: 2018-06-01 | Payer: 59 | Source: Ambulatory Visit

## 2018-06-01 ENCOUNTER — Encounter (HOSPITAL_COMMUNITY): Payer: Self-pay

## 2018-06-01 ENCOUNTER — Encounter

## 2018-07-05 DIAGNOSIS — R300 Dysuria: Secondary | ICD-10-CM | POA: Diagnosis not present

## 2018-07-23 ENCOUNTER — Encounter (INDEPENDENT_AMBULATORY_CARE_PROVIDER_SITE_OTHER): Payer: Self-pay | Admitting: *Deleted

## 2018-07-23 ENCOUNTER — Telehealth (INDEPENDENT_AMBULATORY_CARE_PROVIDER_SITE_OTHER): Payer: Self-pay | Admitting: *Deleted

## 2018-07-23 NOTE — Telephone Encounter (Signed)
Patient needs suprep 

## 2018-07-26 MED ORDER — SUPREP BOWEL PREP KIT 17.5-3.13-1.6 GM/177ML PO SOLN: 1.0000 | Freq: Once | ORAL | 0 refills | Status: AC

## 2018-07-27 DIAGNOSIS — N3020 Other chronic cystitis without hematuria: Secondary | ICD-10-CM | POA: Diagnosis not present

## 2018-07-27 DIAGNOSIS — R8271 Bacteriuria: Secondary | ICD-10-CM | POA: Diagnosis not present

## 2018-08-08 DIAGNOSIS — N3020 Other chronic cystitis without hematuria: Secondary | ICD-10-CM | POA: Diagnosis not present

## 2018-08-14 DIAGNOSIS — N3020 Other chronic cystitis without hematuria: Secondary | ICD-10-CM | POA: Diagnosis not present

## 2018-08-21 ENCOUNTER — Encounter (INDEPENDENT_AMBULATORY_CARE_PROVIDER_SITE_OTHER): Payer: Self-pay | Admitting: *Deleted

## 2018-08-21 NOTE — Telephone Encounter (Addendum)
Error

## 2018-09-03 DIAGNOSIS — R300 Dysuria: Secondary | ICD-10-CM | POA: Diagnosis not present

## 2018-09-13 ENCOUNTER — Telehealth (INDEPENDENT_AMBULATORY_CARE_PROVIDER_SITE_OTHER): Payer: Self-pay | Admitting: *Deleted

## 2018-09-13 NOTE — Telephone Encounter (Signed)
agree

## 2018-09-13 NOTE — Telephone Encounter (Signed)
Referring MD/PCP: hall   Procedure: tcs  Reason/Indication:  screening  Has patient had this procedure before?  no  If so, when, by whom and where?    Is there a family history of colon cancer?  no  Who?  What age when diagnosed?    Is patient diabetic?   no      Does patient have prosthetic heart valve or mechanical valve?  no  Do you have a pacemaker?  no  Has patient ever had endocarditis? no  Has patient had joint replacement within last 12 months?  no  Is patient constipated or do they take laxatives? yes  Does patient have a history of alcohol/drug use?  no  Is patient on blood thinner such as Coumadin, Plavix and/or Aspirin? no  Medications: amlodipine 5 mg daily  Allergies: sulf, lisinopril  Medication Adjustment per Dr Lindi Adie, NP:   Procedure date & time: 10/11/18 at 1030

## 2018-10-01 DIAGNOSIS — I10 Essential (primary) hypertension: Secondary | ICD-10-CM | POA: Diagnosis not present

## 2018-11-19 ENCOUNTER — Other Ambulatory Visit: Payer: Self-pay

## 2018-11-21 ENCOUNTER — Ambulatory Visit: Payer: 59 | Admitting: Obstetrics and Gynecology

## 2018-12-06 ENCOUNTER — Other Ambulatory Visit (HOSPITAL_COMMUNITY): Payer: Self-pay | Admitting: Obstetrics and Gynecology

## 2018-12-06 DIAGNOSIS — Z1231 Encounter for screening mammogram for malignant neoplasm of breast: Secondary | ICD-10-CM

## 2018-12-24 ENCOUNTER — Encounter (INDEPENDENT_AMBULATORY_CARE_PROVIDER_SITE_OTHER): Payer: Self-pay | Admitting: *Deleted

## 2019-01-10 ENCOUNTER — Telehealth (INDEPENDENT_AMBULATORY_CARE_PROVIDER_SITE_OTHER): Payer: Self-pay | Admitting: *Deleted

## 2019-01-10 NOTE — Telephone Encounter (Signed)
Referring MD/PCP: hall   Procedure: tcs  Reason/Indication:  screening  Has patient had this procedure before?  no             If so, when, by whom and where?    Is there a family history of colon cancer?  no             Who?  What age when diagnosed?    Is patient diabetic?   no                                                  Does patient have prosthetic heart valve or mechanical valve?  no  Do you have a pacemaker?  no  Has patient ever had endocarditis? no  Has patient had joint replacement within last 12 months?  no  Is patient constipated or do they take laxatives? yes  Does patient have a history of alcohol/drug use?  no  Is patient on blood thinner such as Coumadin, Plavix and/or Aspirin? no  Medications: amlodipine 5 mg daily  Allergies: sulf, lisinopril  Medication Adjustment per Dr Lindi Adie, NP:   Procedure date & time: 02/07/19 at 830

## 2019-01-10 NOTE — Telephone Encounter (Signed)
agree

## 2019-02-05 ENCOUNTER — Other Ambulatory Visit (HOSPITAL_COMMUNITY): Payer: 59

## 2019-02-07 DIAGNOSIS — Z1211 Encounter for screening for malignant neoplasm of colon: Principal | ICD-10-CM

## 2019-03-01 ENCOUNTER — Ambulatory Visit (HOSPITAL_COMMUNITY): Payer: 59

## 2019-03-11 ENCOUNTER — Other Ambulatory Visit: Payer: Self-pay

## 2019-03-11 ENCOUNTER — Ambulatory Visit (HOSPITAL_COMMUNITY)
Admission: RE | Admit: 2019-03-11 | Discharge: 2019-03-11 | Disposition: A | Discharge status: Home / Self Care | Payer: BC Managed Care – PPO | Source: Ambulatory Visit | Attending: Obstetrics and Gynecology | Admitting: Obstetrics and Gynecology

## 2019-03-11 DIAGNOSIS — Z1231 Encounter for screening mammogram for malignant neoplasm of breast: Secondary | ICD-10-CM | POA: Diagnosis not present

## 2019-03-15 ENCOUNTER — Ambulatory Visit (HOSPITAL_COMMUNITY): Payer: Self-pay

## 2019-03-17 IMAGING — MG DIGITAL SCREENING BILATERAL MAMMOGRAM WITH TOMO AND CAD
8 series · 8 of 24 positions shown · non-contrast
Comparison: Previous exam(s).

ACR Breast Density Category a: The breast tissue is almost entirely
fatty.

CLINICAL DATA: Screening.

EXAM:
DIGITAL SCREENING BILATERAL MAMMOGRAM WITH TOMO AND CAD

[R MLO]
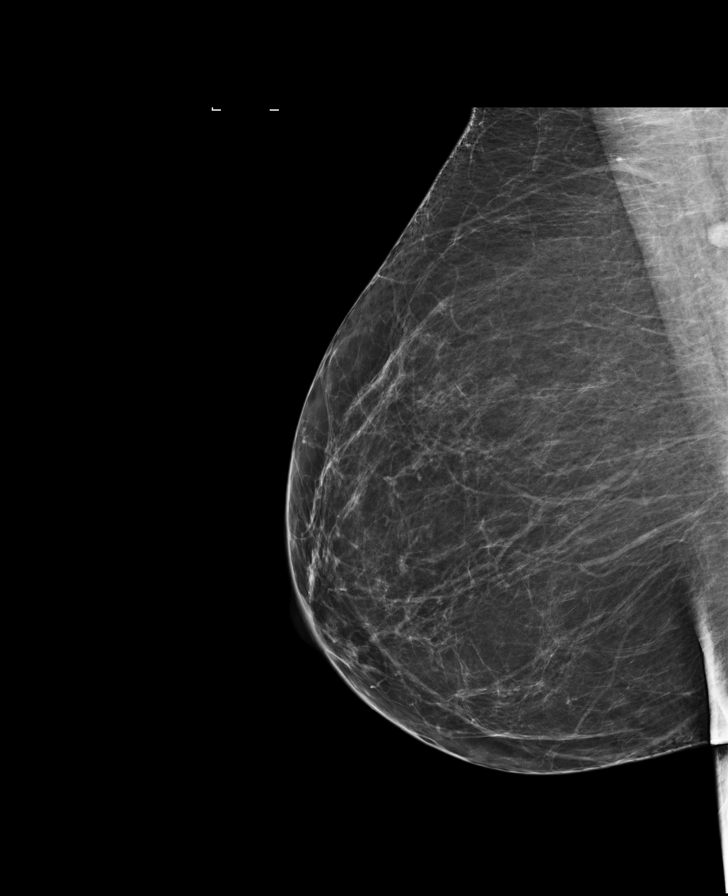

[L CC]
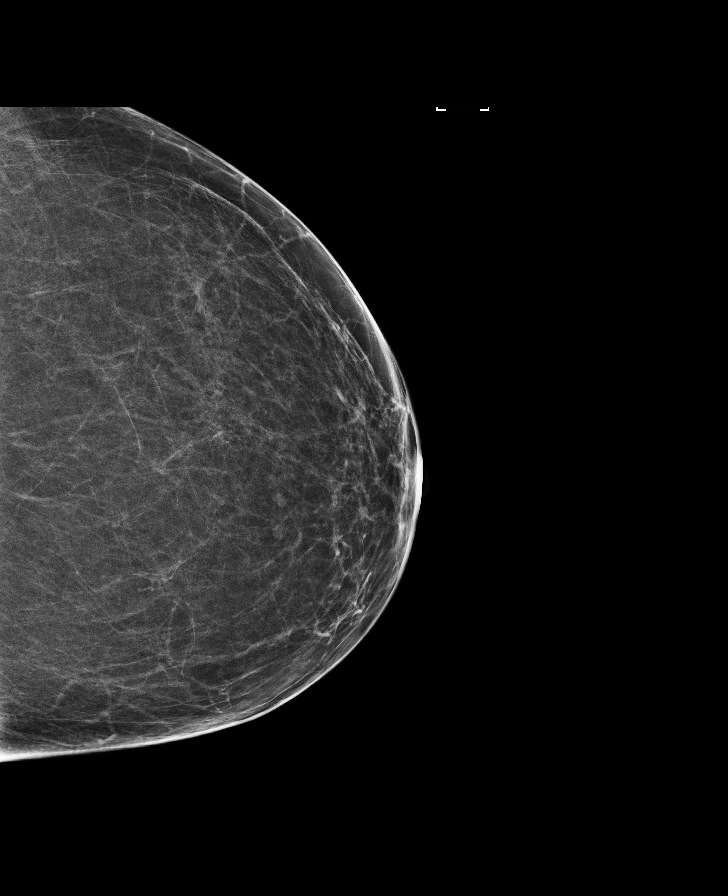

[L MLO]
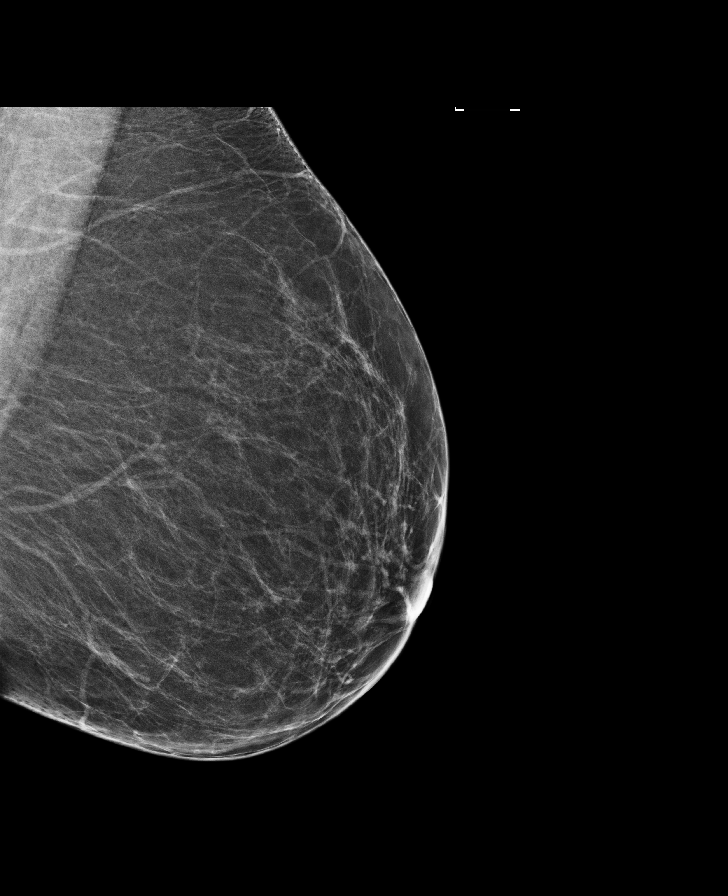

[R CC]
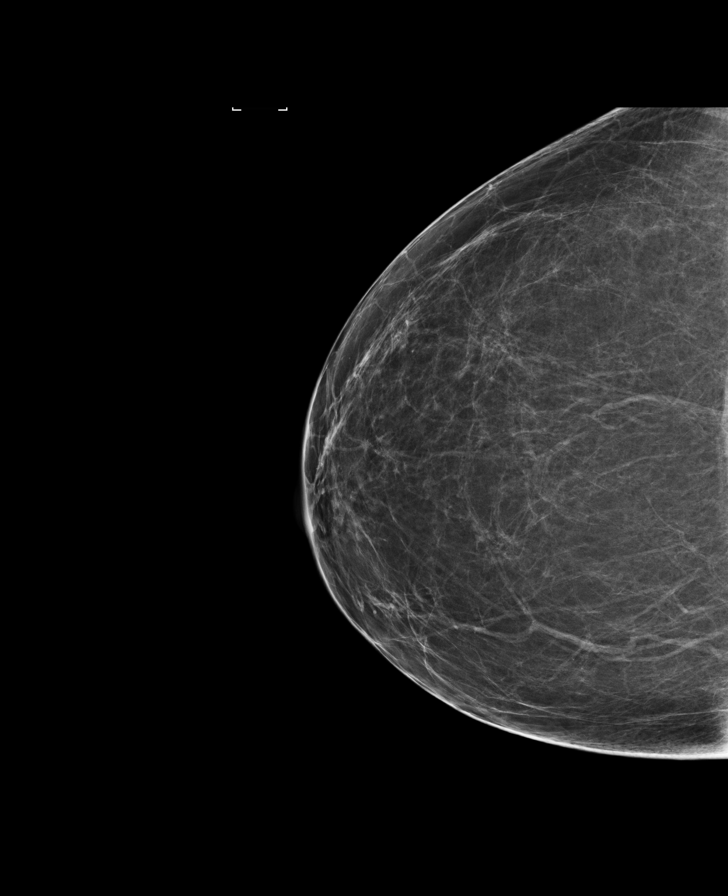

[R MLO tomo · tomo slice 41/82.0]
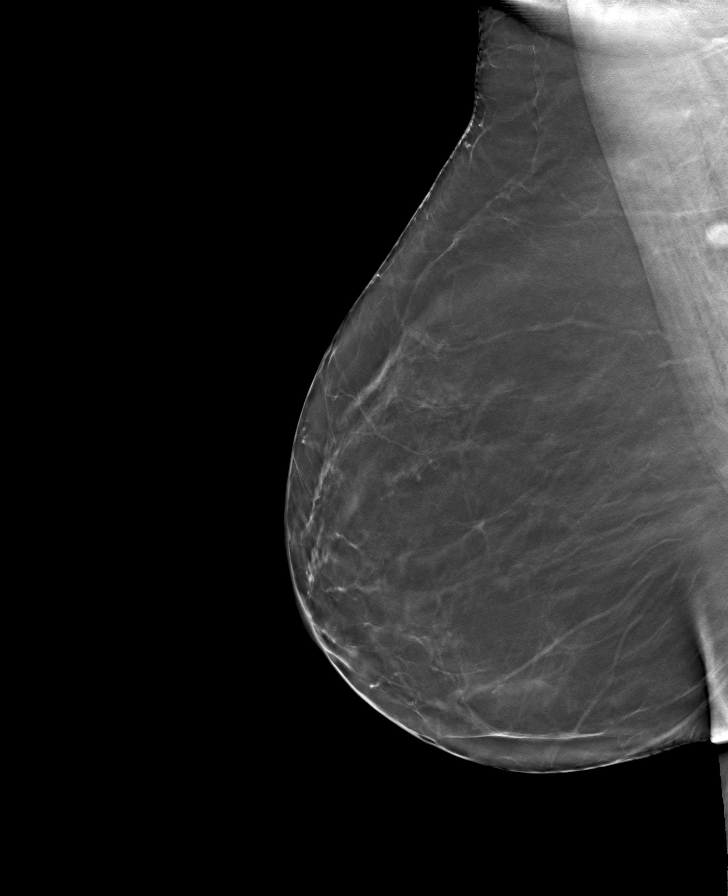

[R CC tomo · tomo slice 39/77.0]
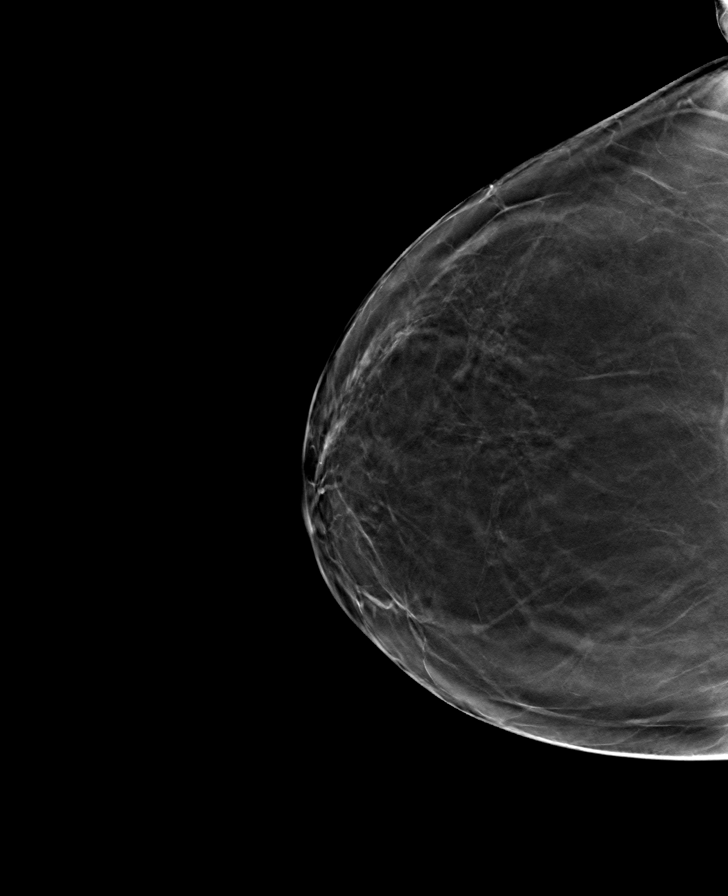

[L MLO tomo · tomo slice 39/76.0]
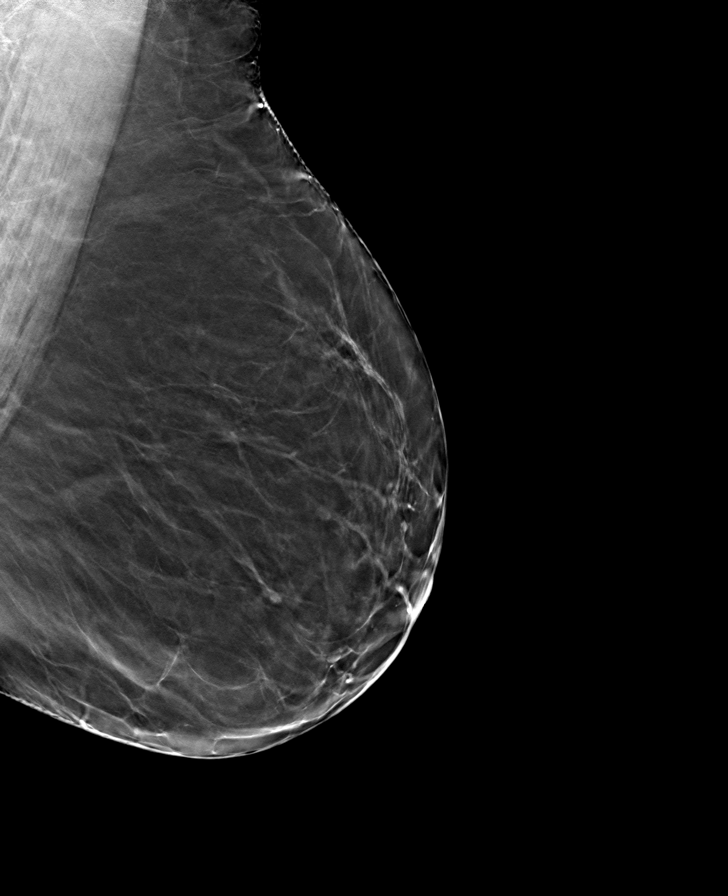

[L CC tomo · tomo slice 38/75.0]
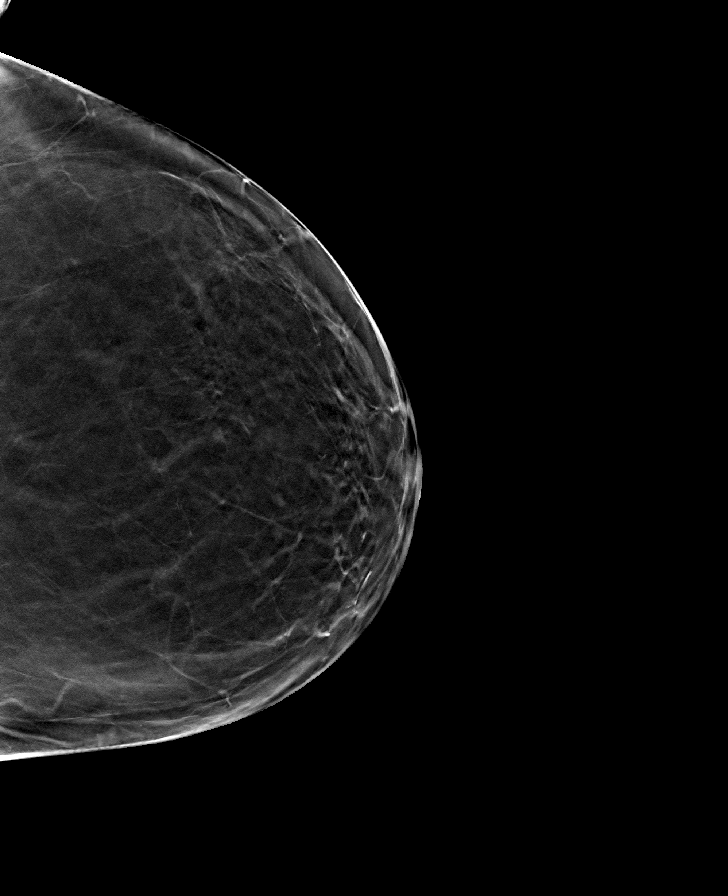

[8 of 24 positions shown; findings below may reference images not displayed]

FINDINGS: There are no findings suspicious for malignancy. Images were
processed with CAD.
IMPRESSION: No mammographic evidence of malignancy. A result letter of this
screening mammogram will be mailed directly to the patient.

RECOMMENDATION:
Screening mammogram in one year. (Code:8Y-Q-[id number])

BI-RADS CATEGORY  1: Negative.

## 2019-03-18 DIAGNOSIS — Z23 Encounter for immunization: Secondary | ICD-10-CM | POA: Diagnosis not present

## 2019-03-26 ENCOUNTER — Encounter (INDEPENDENT_AMBULATORY_CARE_PROVIDER_SITE_OTHER): Payer: Self-pay | Admitting: *Deleted

## 2019-04-11 ENCOUNTER — Telehealth (INDEPENDENT_AMBULATORY_CARE_PROVIDER_SITE_OTHER): Payer: Self-pay | Admitting: *Deleted

## 2019-04-11 ENCOUNTER — Other Ambulatory Visit: Payer: Self-pay

## 2019-04-11 ENCOUNTER — Other Ambulatory Visit (INDEPENDENT_AMBULATORY_CARE_PROVIDER_SITE_OTHER): Payer: Self-pay | Admitting: *Deleted

## 2019-04-11 ENCOUNTER — Ambulatory Visit (INDEPENDENT_AMBULATORY_CARE_PROVIDER_SITE_OTHER): Payer: Self-pay

## 2019-04-11 NOTE — Telephone Encounter (Signed)
Referring MD/PCP:hall   Procedure:tcs  Reason/Indication:screening  Has patient had this procedure before?no If so, when, by whom and where?   Is there a family history of colon cancer?no Who? What age when diagnosed?   Is patient diabetic?no  Does patient have prosthetic heart valveor mechanical valve?no  Do you have a pacemaker?no  Has patient ever had endocarditis?no  Has patient had joint replacement within last 12 months?no  Is patient constipated or do they take laxatives?yes  Does patient have a history of alcohol/drug use?no  Is patient onblood thinner such as Coumadin, Plavix and/or Aspirin? no  Medications:amlodipine 5 mg daily  Allergies:sulf, lisinopril  Medication Adjustment per Dr Lindi Adie, NP:   Procedure date & time:05/09/19 at 730

## 2019-04-18 NOTE — Progress Notes (Signed)
58 y.o. G42P2002 Married Caucasian female here for annual exam.    Nervous to be here today due to the pandemic.   Denies vaginal bleeding.  She can have some post coital spotting.   Patient complaining of vaginal dryness.  She is having UTIs, and she was sent to urology.   Back working in the office.   Had labs done in August, 2020.  See Care Everywhere.   PCP:  Allyn Kenner, MD   Patient's last menstrual period was 08/18/2013.           Sexually active: Yes.    The current method of family planning is vasectomy.    Exercising: Yes.    walking Smoker:  no   Health Maintenance: Pap: 10-19-17 Neg:Neg HR HPV, 12-28-15 Neg:Neg HR HPV History of abnormal Pap:  no MMG: 03-10-09 3D/Neg/density A/BiRads1 Colonoscopy: Appt.05-09-19 with Dr.Rehman BMD:   n/a  Result  n/a TDaP:  2014 Gardasil:   n/a HIV:Neg in the past  Hep C:12-06-17 Neg Screening Labs:  ----  Flu vaccine:  Completed.    reports that she has never smoked. She has never used smokeless tobacco. She reports that she does not drink alcohol or use drugs.  Past Medical History:  Diagnosis Date  . Hypertension   . SVD (spontaneous vaginal delivery)    x 1    Past Surgical History:  Procedure Laterality Date  . CESAREAN SECTION  1987  . DILATATION & CURRETTAGE/HYSTEROSCOPY WITH RESECTOCOPE N/A 04/16/2013   Procedure: DILATATION & CURETTAGE/HYSTEROSCOPY;  Surgeon: Arloa Koh, MD;  Location: [facility];  Service: Gynecology;  Laterality: N/A;    Current Outpatient Medications  Medication Sig Dispense Refill  . acetaminophen (TYLENOL) 500 MG tablet Take 1,000 mg by mouth daily as needed for pain.    Marland Kitchen ALPRAZolam (XANAX) 0.25 MG tablet Take 0.25 mg by mouth daily as needed.    Marland Kitchen amLODipine (NORVASC) 10 MG tablet Take 10 mg by mouth daily.    . Cholecalciferol (VITAMIN D3) 2000 units TABS Take 1 tablet by mouth daily.    . Ibuprofen (ADVIL PO) Take 400-600 mg by mouth daily as needed (pain).      No current  facility-administered medications for this visit.     Family History  Problem Relation Age of Onset  . Hypertension Mother   . Deep vein thrombosis Mother   . Thyroid disease Mother   . Osteoporosis Mother   . Depression Mother   . Anxiety disorder Mother   . Multiple myeloma Mother        Dec multiple myeloma age 16  . Thyroid disease Sister   . Anxiety disorder Sister   . Depression Sister   . Cancer Father        non Hodgkin's lymphoma    Review of Systems  All other systems reviewed and are negative.   Exam:   BP (!) 156/90   Pulse 80   Temp 97.9 F (36.6 C) (Temporal)   Resp 18   Ht 5' 2.5" (1.588 m)   Wt 172 lb 12.8 oz (78.4 kg)   LMP 08/18/2013   BMI 31.10 kg/m     General appearance: alert, cooperative and appears stated age Head: normocephalic, without obvious abnormality, atraumatic Neck: no adenopathy, supple, symmetrical, trachea midline and thyroid normal to inspection and palpation Lungs: clear to auscultation bilaterally Breasts: normal appearance, no masses or tenderness, No nipple retraction or dimpling, No nipple discharge or bleeding, No axillary adenopathy Heart: regular rate and  rhythm Abdomen: soft, non-tender; no masses, no organomegaly Extremities: extremities normal, atraumatic, no cyanosis or edema Skin: skin color, texture, turgor normal. No rashes or lesions Lymph nodes: cervical, supraclavicular, and axillary nodes normal. Neurologic: grossly normal  Pelvic: External genitalia:  no lesions              No abnormal inguinal nodes palpated.              Urethra:  normal appearing urethra with no masses, tenderness or lesions              Bartholins and Skenes: normal                 Vagina: normal appearing vagina with normal color and discharge, no lesions              Cervix: no lesions              Pap taken: No. Bimanual Exam:  Uterus:  normal size, contour, position, consistency, mobility, non-tender              Adnexa: no mass,  fullness, tenderness              Rectal exam: Yes.  .  Confirms.              Anus:  normal sphincter tone, no lesions  Chaperone was present for exam.  Assessment:   Well woman visit with normal exam. Vaginal atrophy.  Recurrent UTIs.   Plan: Mammogram screening discussed. Self breast awareness reviewed. Pap and HR HPV 2024. Guidelines for Calcium, Vitamin D, regular exercise program including cardiovascular and weight bearing exercise. Rx for Premarin vaginal cream. She will use 1/2 gm vaginally with a pea size amount at the urethra at hs for 2 weeks and then 2 - 3 time weekly.  I discussed potential effect on breast cancer. Follow up annually and prn.   After visit summary provided.

## 2019-04-22 ENCOUNTER — Ambulatory Visit: Payer: BC Managed Care – PPO | Admitting: Obstetrics and Gynecology

## 2019-04-22 ENCOUNTER — Other Ambulatory Visit: Payer: Self-pay

## 2019-04-22 ENCOUNTER — Encounter: Payer: Self-pay | Admitting: Obstetrics and Gynecology

## 2019-04-22 VITALS — BP 156/90 | HR 80 | Temp 97.9°F | Resp 18 | Ht 62.5 in | Wt 172.8 lb

## 2019-04-22 DIAGNOSIS — Z01419 Encounter for gynecological examination (general) (routine) without abnormal findings: Secondary | ICD-10-CM | POA: Diagnosis not present

## 2019-04-22 MED ORDER — PREMARIN 0.625 MG/GM VA CREA
TOPICAL_CREAM | VAGINAL | 3 refills | Status: DC
Start: 2019-04-22 — End: 2021-10-21

## 2019-04-22 NOTE — Patient Instructions (Signed)

## 2019-04-24 ENCOUNTER — Encounter: Payer: Self-pay | Admitting: Obstetrics and Gynecology

## 2019-04-25 ENCOUNTER — Telehealth: Payer: Self-pay | Admitting: Obstetrics and Gynecology

## 2019-04-25 NOTE — Telephone Encounter (Signed)
[facility] for vaginal estradiol cream 0.02%. Sig:  Place 1/2 gram pv at hs x 2 weeks.  Then place 1/2 gram pv at hs 2 - 3 times per week.  Disp:  8 grams, RF  11.

## 2019-04-25 NOTE — Telephone Encounter (Signed)
Spoke with patient. Premarin and estrace vaginal cream not covered by insurance, requesting compounded vaginal estrogen cream to [facility].   Advised patient I will review with Dr. Quincy Simmonds and notify once RX sent. Patient agreeable.    Rx pended -Dr. Quincy Simmonds please review.

## 2019-04-25 NOTE — Telephone Encounter (Signed)
Patient sent the following correspondence through [facility].  Hi, Dr Quincy Simmonds prescribed preparing vaginal cream. I cannot afford this.Marland Kitchenit is $440. Could you see if she can call in to the compounding dept at the [facility], [phone], [phone], to see if maybe I could get estradiol? I believe they said this is much cheaper. I appreciate your help. Thank you. Rosendo Gros

## 2019-04-26 MED ORDER — NONFORMULARY OR COMPOUNDED ITEM: 11 refills | Status: DC

## 2019-04-26 NOTE — Telephone Encounter (Signed)
Spoke with Gerald Stabs, Counselling psychologist at [facility]. Verbal order given as seen below. Read back and confirmed.   Compounded vaginal estradiol cream 0.02%. Sig:  Place 1/2 gram pv at hs x 2 weeks.  Then place 1/2 gram pv at hs 2 - 3 times per week.  Disp:  8 grams, RF  11.    Rx for premarin cancelled.   Patient notified of RX.   Encounter closed.

## 2019-05-07 ENCOUNTER — Other Ambulatory Visit (HOSPITAL_COMMUNITY): Payer: BC Managed Care – PPO

## 2019-05-23 ENCOUNTER — Encounter (INDEPENDENT_AMBULATORY_CARE_PROVIDER_SITE_OTHER): Payer: Self-pay | Admitting: *Deleted

## 2019-05-23 ENCOUNTER — Other Ambulatory Visit (INDEPENDENT_AMBULATORY_CARE_PROVIDER_SITE_OTHER): Payer: Self-pay | Admitting: *Deleted

## 2019-05-30 ENCOUNTER — Encounter (INDEPENDENT_AMBULATORY_CARE_PROVIDER_SITE_OTHER): Payer: Self-pay | Admitting: *Deleted

## 2019-06-11 ENCOUNTER — Ambulatory Visit (INDEPENDENT_AMBULATORY_CARE_PROVIDER_SITE_OTHER): Payer: Self-pay

## 2019-06-11 ENCOUNTER — Other Ambulatory Visit: Payer: Self-pay

## 2019-06-11 ENCOUNTER — Telehealth (INDEPENDENT_AMBULATORY_CARE_PROVIDER_SITE_OTHER): Payer: Self-pay | Admitting: *Deleted

## 2019-06-11 NOTE — Telephone Encounter (Signed)
Referring MD/PCP:hall   Procedure:tcs  Reason/Indication:screening  Has patient had this procedure before?no If so, when, by whom and where?   Is there a family history of colon cancer?no Who? What age when diagnosed?   Is patient diabetic?no  Does patient have prosthetic heart valveor mechanical valve?no  Do you have a pacemaker?no  Has patient ever had endocarditis?no  Has patient had joint replacement within last 12 months?no  Is patient constipated or do they take laxatives?yes  Does patient have a history of alcohol/drug use?no  Is patient onblood thinner such as Coumadin, Plavix and/or Aspirin? no  Medications:amlodipine 5 mg daily  Allergies:sulf, lisinopril  Medication Adjustment per Dr Laural Golden  Procedure date & time:07/10/19 at 1200

## 2019-06-25 NOTE — Telephone Encounter (Signed)
Colonoscopy with conscious sedation 

## 2019-07-08 ENCOUNTER — Other Ambulatory Visit (HOSPITAL_COMMUNITY): Payer: BC Managed Care – PPO

## 2019-07-10 ENCOUNTER — Ambulatory Visit (HOSPITAL_COMMUNITY): Admission: RE | Admit: 2019-07-10 | Payer: 59 | Source: Home / Self Care | Admitting: Internal Medicine

## 2019-07-10 ENCOUNTER — Encounter (HOSPITAL_COMMUNITY): Admission: RE | Payer: Self-pay | Source: Home / Self Care

## 2019-07-10 DIAGNOSIS — Z1211 Encounter for screening for malignant neoplasm of colon: Secondary | ICD-10-CM | POA: Insufficient documentation

## 2019-07-10 SURGERY — COLONOSCOPY
Anesthesia: Moderate Sedation

## 2020-06-24 ENCOUNTER — Ambulatory Visit: Payer: BC Managed Care – PPO | Admitting: Obstetrics and Gynecology

## 2020-09-08 ENCOUNTER — Other Ambulatory Visit (HOSPITAL_COMMUNITY): Payer: Self-pay | Admitting: Obstetrics and Gynecology

## 2020-09-08 DIAGNOSIS — Z1231 Encounter for screening mammogram for malignant neoplasm of breast: Secondary | ICD-10-CM

## 2020-09-23 ENCOUNTER — Ambulatory Visit (HOSPITAL_COMMUNITY): Payer: Self-pay

## 2020-10-23 ENCOUNTER — Ambulatory Visit (HOSPITAL_COMMUNITY)
Admission: RE | Admit: 2020-10-23 | Discharge: 2020-10-23 | Disposition: A | Discharge status: Home / Self Care | Payer: 59 | Source: Ambulatory Visit | Attending: Obstetrics and Gynecology | Admitting: Obstetrics and Gynecology

## 2020-10-23 DIAGNOSIS — Z1231 Encounter for screening mammogram for malignant neoplasm of breast: Secondary | ICD-10-CM | POA: Diagnosis not present

## 2021-01-19 DIAGNOSIS — E669 Obesity, unspecified: Secondary | ICD-10-CM | POA: Insufficient documentation

## 2021-01-19 DIAGNOSIS — I10 Essential (primary) hypertension: Secondary | ICD-10-CM | POA: Insufficient documentation

## 2021-01-19 DIAGNOSIS — E611 Iron deficiency: Secondary | ICD-10-CM | POA: Insufficient documentation

## 2021-01-19 DIAGNOSIS — E559 Vitamin D deficiency, unspecified: Secondary | ICD-10-CM | POA: Insufficient documentation

## 2021-07-22 ENCOUNTER — Encounter (INDEPENDENT_AMBULATORY_CARE_PROVIDER_SITE_OTHER): Payer: Self-pay

## 2021-08-12 ENCOUNTER — Encounter (INDEPENDENT_AMBULATORY_CARE_PROVIDER_SITE_OTHER): Payer: Self-pay | Admitting: *Deleted

## 2021-08-12 ENCOUNTER — Telehealth (INDEPENDENT_AMBULATORY_CARE_PROVIDER_SITE_OTHER): Payer: Self-pay | Admitting: *Deleted

## 2021-08-12 MED ORDER — NA SULFATE-K SULFATE-MG SULF 17.5-3.13-1.6 GM/177ML PO SOLN: 1.0000 | Freq: Once | ORAL | 0 refills | Status: AC

## 2021-08-12 NOTE — Telephone Encounter (Signed)
Patient needs suprep 

## 2021-08-13 ENCOUNTER — Other Ambulatory Visit (INDEPENDENT_AMBULATORY_CARE_PROVIDER_SITE_OTHER): Payer: Self-pay

## 2021-08-13 DIAGNOSIS — Z1211 Encounter for screening for malignant neoplasm of colon: Secondary | ICD-10-CM

## 2021-09-15 ENCOUNTER — Telehealth (INDEPENDENT_AMBULATORY_CARE_PROVIDER_SITE_OTHER): Payer: Self-pay | Admitting: *Deleted

## 2021-09-15 NOTE — Telephone Encounter (Signed)
Referring MD/PCP: keatts ? ?Procedure: tcs ? ?Reason/Indication:  screening ? ?Has patient had this procedure before?  no ? If so, when, by whom and where?   ? ?Is there a family history of colon cancer?  no ? Who?  What age when diagnosed?   ? ?Is patient diabetic? If yes, Type 1 or Type 2   no ?     ?Does patient have prosthetic heart valve or mechanical valve?  no ? ?Do you have a pacemaker/defibrillator?  no ? ?Has patient ever had endocarditis/atrial fibrillation? no ? ?Does patient use oxygen? no ? ?Has patient had joint replacement within last 12 months?  no ? ?Is patient constipated or do they take laxatives? no ? ?Does patient have a history of alcohol/drug use?  no ? ?Have you had a stroke/heart attack last 6 mths? no ? ?Do you take medicine for weight loss?  no ? ?For female patients,: have you had a hysterectomy no ?                     are you post menopausal yes ?                     do you still have your menstrual cycle no ? ?Is patient on blood thinner such as Coumadin, Plavix and/or Aspirin? no ? ?Medications: amlodipine 10 mg daily ? ?Allergies: sulfa, lisinopril ? ?Medication Adjustment per Dr Rehman/Dr Jenetta Downer  ? ?Procedure date & time: 10/14/21 ? ? ?

## 2021-10-28 ENCOUNTER — Other Ambulatory Visit: Payer: Self-pay

## 2021-10-28 ENCOUNTER — Ambulatory Visit (HOSPITAL_COMMUNITY)
Admission: RE | Admit: 2021-10-28 | Discharge: 2021-10-28 | Disposition: A | Discharge status: Home / Self Care | Payer: 59 | Attending: Internal Medicine | Admitting: Internal Medicine

## 2021-10-28 ENCOUNTER — Encounter (HOSPITAL_COMMUNITY)
Admission: RE | Disposition: A | Discharge status: Home / Self Care | Payer: Self-pay | Source: Home / Self Care | Attending: Internal Medicine

## 2021-10-28 ENCOUNTER — Ambulatory Visit (HOSPITAL_BASED_OUTPATIENT_CLINIC_OR_DEPARTMENT_OTHER): Payer: 59 | Admitting: Anesthesiology

## 2021-10-28 ENCOUNTER — Ambulatory Visit (HOSPITAL_COMMUNITY): Payer: 59 | Admitting: Anesthesiology

## 2021-10-28 ENCOUNTER — Encounter (HOSPITAL_COMMUNITY): Payer: Self-pay | Admitting: Internal Medicine

## 2021-10-28 DIAGNOSIS — Z139 Encounter for screening, unspecified: Secondary | ICD-10-CM | POA: Diagnosis not present

## 2021-10-28 DIAGNOSIS — K644 Residual hemorrhoidal skin tags: Secondary | ICD-10-CM | POA: Insufficient documentation

## 2021-10-28 DIAGNOSIS — R69 Illness, unspecified: Secondary | ICD-10-CM | POA: Diagnosis not present

## 2021-10-28 DIAGNOSIS — D125 Benign neoplasm of sigmoid colon: Secondary | ICD-10-CM | POA: Diagnosis not present

## 2021-10-28 DIAGNOSIS — D127 Benign neoplasm of rectosigmoid junction: Secondary | ICD-10-CM | POA: Diagnosis not present

## 2021-10-28 DIAGNOSIS — Z644 Discord with counselors: Secondary | ICD-10-CM

## 2021-10-28 DIAGNOSIS — I10 Essential (primary) hypertension: Secondary | ICD-10-CM | POA: Diagnosis not present

## 2021-10-28 DIAGNOSIS — Z1211 Encounter for screening for malignant neoplasm of colon: Secondary | ICD-10-CM | POA: Insufficient documentation

## 2021-10-28 DIAGNOSIS — K635 Polyp of colon: Secondary | ICD-10-CM | POA: Diagnosis not present

## 2021-10-28 HISTORY — PX: POLYPECTOMY: SHX5525

## 2021-10-28 HISTORY — PX: COLONOSCOPY WITH PROPOFOL: SHX5780

## 2021-10-28 LAB — HM COLONOSCOPY

## 2021-10-28 SURGERY — COLONOSCOPY WITH PROPOFOL
Anesthesia: General

## 2021-10-28 MED ORDER — PROPOFOL 10 MG/ML IV BOLUS
INTRAVENOUS | Status: DC | PRN
  Administered 2021-10-28: 40 mg via INTRAVENOUS
  Administered 2021-10-28: 50 mg via INTRAVENOUS
  Administered 2021-10-28: 100 mg via INTRAVENOUS

## 2021-10-28 MED ORDER — LACTATED RINGERS IV SOLN: INTRAVENOUS | Status: DC

## 2021-10-28 MED ORDER — PROPOFOL 500 MG/50ML IV EMUL
INTRAVENOUS | Status: DC | PRN
Start: 2021-10-28 — End: 2021-10-28
  Administered 2021-10-28: 125 ug/kg/min via INTRAVENOUS

## 2021-10-28 MED ORDER — LIDOCAINE HCL (CARDIAC) PF 50 MG/5ML IV SOSY
PREFILLED_SYRINGE | INTRAVENOUS | Status: DC | PRN
  Administered 2021-10-28: 50 mg via INTRAVENOUS

## 2021-10-28 NOTE — Anesthesia Procedure Notes (Signed)
Date/Time: 10/28/2021 9:12 AM ?Performed by: Vista Deck, CRNA ?Pre-anesthesia Checklist: Patient identified, Emergency Drugs available, Suction available, Timeout performed and Patient being monitored ?Patient Re-evaluated:Patient Re-evaluated prior to induction ?Oxygen Delivery Method: Nasal Cannula ? ? ? ? ?

## 2021-10-28 NOTE — Op Note (Signed)
[facility] ?Patient Name: Cheyenne Christensen ?Procedure Date: 10/28/2021 8:53 AM ?MRN: [id number] ?Date of Birth: 09-05-1960 ?Attending MD: Hildred Laser , MD ?CSN: [id number] ?Age: 61 ?Admit Type: Outpatient ?Procedure:                Colonoscopy ?Indications:              Screening for colorectal malignant neoplasm ?Providers:                Hildred Laser, MD, Charlsie Quest Theda Sers RN, Therapist, sports,  ?                          Kristine L. Risa Grill, Technician ?Referring MD:             Pablo Lawrence, NP ?Medicines:                Propofol per Anesthesia ?Complications:            No immediate complications. ?Estimated Blood Loss:     Estimated blood loss was minimal. ?Procedure:                Pre-Anesthesia Assessment: ?                          - Prior to the procedure, a History and Physical  ?                          was performed, and patient medications and  ?                          allergies were reviewed. The patient's tolerance of  ?                          previous anesthesia was also reviewed. The risks  ?                          and benefits of the procedure and the sedation  ?                          options and risks were discussed with the patient.  ?                          All questions were answered, and informed consent  ?                          was obtained. Prior Anticoagulants: The patient has  ?                          taken no previous anticoagulant or antiplatelet  ?                          agents except for NSAID medication. ASA Grade  ?                          Assessment: II - A patient with mild systemic  ?  disease. After reviewing the risks and benefits,  ?                          the patient was deemed in satisfactory condition to  ?                          undergo the procedure. ?                          After obtaining informed consent, the colonoscope  ?                          was passed under direct vision. Throughout the  ?                           procedure, the patient's blood pressure, pulse, and  ?                          oxygen saturations were monitored continuously. The  ?                          PCF-HQ190L ([id number]) scope was introduced through  ?                          the anus and advanced to the the cecum, identified  ?                          by appendiceal orifice and ileocecal valve. The  ?                          colonoscopy was performed without difficulty. The  ?                          patient tolerated the procedure well. The quality  ?                          of the bowel preparation was good. The ileocecal  ?                          valve, appendiceal orifice, and rectum were  ?                          photographed. ?Scope In: 9:19:07 AM ?Scope Out: 9:40:18 AM ?Scope Withdrawal Time: 0 hours 15 minutes 27 seconds  ?Total Procedure Duration: 0 hours 21 minutes 11 seconds  ?Findings: ?     The perianal and digital rectal examinations were normal. ?     A 7 mm polyp was found in the proximal sigmoid colon. The polyp was  ?     semi-pedunculated. The polyp was removed with a hot snare. Resection and  ?     retrieval were complete. The pathology specimen was placed into Bottle  ?     Number 1. ?     A 6 mm polyp was found in the recto-sigmoid colon. The polyp was  ?     semi-pedunculated. The  polyp was removed with a hot snare. Resection and  ?     retrieval were complete. The pathology specimen was placed into Bottle  ?     Number 2. ?     A small polyp was found in the recto-sigmoid colon. The polyp was flat.  ?     The polyp was removed with a cold snare. Resection and retrieval were  ?     complete. The pathology specimen was placed into Bottle Number 2. ?     External hemorrhoids were found during retroflexion. The hemorrhoids  ?     were small. ?Impression:               - One 7 mm polyp in the proximal sigmoid colon,  ?                          removed with a hot snare. Resected and retrieved. ?                          - One  6 mm polyp at the recto-sigmoid colon,  ?                          removed with a hot snare. Resected and retrieved. ?                          - One small polyp at the recto-sigmoid colon,  ?                          removed with a cold snare. Resected and retrieved. ?                          - External hemorrhoids. ?Moderate Sedation: ?     Per Anesthesia Care ?Recommendation:           - Patient has a contact number available for  ?                          emergencies. The signs and symptoms of potential  ?                          delayed complications were discussed with the  ?                          patient. Return to normal activities tomorrow.  ?                          Written discharge instructions were provided to the  ?                          patient. ?                          - Resume previous diet today. ?                          - Continue present medications. ?                          -  No aspirin, ibuprofen, naproxen, or other  ?                          non-steroidal anti-inflammatory drugs for 5 days. ?                          - Await pathology results. ?                          - Repeat colonoscopy in 5 years for surveillance. ?Procedure Code(s):        --- Professional --- ?                          [id number], Colonoscopy, flexible; with removal of  ?                          tumor(s), polyp(s), or other lesion(s) by snare  ?                          technique ?Diagnosis Code(s):        --- Professional --- ?                          Z12.11, Encounter for screening for malignant  ?                          neoplasm of colon ?                          K63.5, Polyp of colon ?                          K64.4, Residual hemorrhoidal skin tags ?CPT copyright 2019 [employer] Medical Association. All rights reserved. ?The codes documented in this report are preliminary and upon coder review may  ?be revised to meet current compliance requirements. ?Hildred Laser, MD ?Hildred Laser, MD ?10/28/2021  9:49:58 AM ?This report has been signed electronically. ?Number of Addenda: 0 ?

## 2021-10-28 NOTE — H&P (Signed)
Cheyenne Christensen is an 61 y.o. female.   ?Chief Complaint: Patient is here for colonoscopy ?HPI: Patient is 61 year old Caucasian female who is here for screening examination.  This is first exam.  Cheyenne Christensen denies abdominal pain change in bowel habits or rectal bleeding. ?Cheyenne Christensen does not take aspirin.  Cheyenne Christensen takes ibuprofen on as-needed basis. ?Family history is negative for colon cancer but positive for other malignancies.  Cheyenne Christensen father had non-Hodgkin's lymphoma and Cheyenne Christensen mother had multiple myeloma. ? ?Past Medical History:  ?Diagnosis Date  ? Hypertension   ? SVD (spontaneous vaginal delivery)   ? x 1  ? ? ?Past Surgical History:  ?Procedure Laterality Date  ? [place]  ? DILATATION & CURRETTAGE/HYSTEROSCOPY WITH RESECTOCOPE N/A 04/16/2013  ? Procedure: DILATATION & CURETTAGE/HYSTEROSCOPY;  Surgeon: Arloa Koh, MD;  Location: [facility];  Service: Gynecology;  Laterality: N/A;  ? ? ?Family History  ?Problem Relation Age of Onset  ? Hypertension Mother   ? Deep vein thrombosis Mother   ? Thyroid disease Mother   ? Osteoporosis Mother   ? Depression Mother   ? Anxiety disorder Mother   ? Multiple myeloma Mother   ?     Dec multiple myeloma age 59  ? Thyroid disease Sister   ? Anxiety disorder Sister   ? Depression Sister   ? Cancer Father   ?     non Hodgkin's lymphoma  ? ?Social History:  reports that Cheyenne Christensen has never smoked. Cheyenne Christensen has never used smokeless tobacco. Cheyenne Christensen reports that Cheyenne Christensen does not drink alcohol and does not use drugs. ? ?Allergies:  ?Allergies  ?Allergen Reactions  ? Lisinopril Rash  ? Sulfa Antibiotics Rash  ? ? ?Medications Prior to Admission  ?Medication Sig Dispense Refill  ? acetaminophen (TYLENOL) 500 MG tablet Take 1,000 mg by mouth every 8 (eight) hours as needed for pain.    ? amLODipine (NORVASC) 10 MG tablet Take 10 mg by mouth daily.    ? Calcium Carb-Cholecalciferol (CALCIUM 600/VITAMIN D3 PO) Take 1 tablet by mouth daily.    ? ibuprofen (ADVIL) 200 MG tablet Take 400-600 mg by mouth every 8  (eight) hours as needed (pain).    ? ? ?No results found for this or any previous visit (from the past 48 hour(s)). ?No results found. ? ?Review of Systems ? ?Blood pressure (!) 137/95, temperature 97.8 ?F (36.6 ?C), temperature source Oral, resp. rate 16, height 5' 2"  (1.575 m), weight 83.9 kg, last menstrual period 08/18/2013, SpO2 100 %. ?Physical Exam ?HENT:  ?   Mouth/Throat:  ?   Mouth: Mucous membranes are moist.  ?   Pharynx: Oropharynx is clear.  ?Eyes:  ?   General: No scleral icterus. ?   Conjunctiva/sclera: Conjunctivae normal.  ?Cardiovascular:  ?   Rate and Rhythm: Normal rate and regular rhythm.  ?   Heart sounds: Normal heart sounds. No murmur heard. ?Pulmonary:  ?   Effort: Pulmonary effort is normal.  ?   Breath sounds: Normal breath sounds.  ?Abdominal:  ?   General: There is no distension.  ?   Palpations: Abdomen is soft. There is no mass.  ?   Tenderness: There is no abdominal tenderness.  ?Musculoskeletal:     ?   General: No swelling.  ?   Cervical back: Neck supple.  ?Lymphadenopathy:  ?   Cervical: No cervical adenopathy.  ?Skin: ?   General: Skin is warm and dry.  ?Neurological:  ?   Mental Status:  Cheyenne Christensen is alert.  ?  ? ?Assessment/Plan ? ?Average risk screening colonoscopy. ? ?Hildred Laser, MD ?10/28/2021, 9:08 AM ? ? ? ?

## 2021-10-28 NOTE — Transfer of Care (Signed)
Immediate Anesthesia Transfer of Care Note ? ?Patient: Cheyenne Christensen ? ?Procedure(s) Performed: COLONOSCOPY WITH PROPOFOL ?POLYPECTOMY ? ?Patient Location: Endoscopy Unit ? ?Anesthesia Type:General ? ?Level of Consciousness: awake and patient cooperative ? ?Airway & Oxygen Therapy: Patient Spontanous Breathing ? ?Post-op Assessment: Report given to RN and Post -op Vital signs reviewed and stable ? ?Post vital signs: Reviewed and stable ? ?Last Vitals:  ?Vitals Value Taken Time  ?BP 88/63 0945  ?Temp 97.7 0945  ?Pulse 73 0945  ?Resp 20 0945  ?SpO2 96 0945  ? ? ?Last Pain:  ?Vitals:  ? 10/28/21 0917  ?TempSrc:   ?PainSc: 0-No pain  ?   ? ?Patients Stated Pain Goal: 9 (10/28/21 0759) ? ?Complications: No notable events documented. ?

## 2021-10-28 NOTE — Discharge Instructions (Addendum)
No aspirin or NSAIDs for 5 days ?Resume scheduled medications as before ?Resume usual diet ?No driving for 24 hours ?Physician will call with biopsy results. ?

## 2021-10-28 NOTE — Anesthesia Postprocedure Evaluation (Signed)
Anesthesia Post Note ? ?Patient: LEXXUS UNDERHILL ? ?Procedure(s) Performed: COLONOSCOPY WITH PROPOFOL ?POLYPECTOMY ? ?Patient location during evaluation: Phase II ?Anesthesia Type: General ?Level of consciousness: awake ?Pain management: pain level controlled ?Vital Signs Assessment: post-procedure vital signs reviewed and stable ?Respiratory status: spontaneous breathing and respiratory function stable ?Cardiovascular status: blood pressure returned to baseline and stable ?Postop Assessment: no headache and no apparent nausea or vomiting ?Anesthetic complications: no ?Comments: Late entry ? ? ?No notable events documented. ? ? ?Last Vitals:  ?Vitals:  ? 10/28/21 0944 10/28/21 0949  ?BP: (!) 89/58 102/68  ?Pulse: 71   ?Resp: (!) 21   ?Temp: 36.5 ?C   ?SpO2: 97%   ?  ?Last Pain:  ?Vitals:  ? 10/28/21 0944  ?TempSrc: Oral  ?PainSc: 0-No pain  ? ? ?  ?  ?  ?  ?  ?  ? ?Louann Sjogren ? ? ? ? ?

## 2021-10-28 NOTE — Anesthesia Preprocedure Evaluation (Signed)
Anesthesia Evaluation  Patient identified by MRN, date of birth, ID band Patient awake    Reviewed: Allergy & Precautions, H&P , NPO status , Patient's Chart, lab work & pertinent test results, reviewed documented beta blocker date and time   Airway Mallampati: II  TM Distance: >3 FB Neck ROM: full    Dental no notable dental hx.    Pulmonary neg pulmonary ROS,    Pulmonary exam normal breath sounds clear to auscultation       Cardiovascular Exercise Tolerance: Good hypertension, negative cardio ROS   Rhythm:regular Rate:Normal     Neuro/Psych negative neurological ROS  negative psych ROS   GI/Hepatic negative GI ROS, Neg liver ROS,   Endo/Other  negative endocrine ROS  Renal/GU negative Renal ROS  negative genitourinary   Musculoskeletal   Abdominal   Peds  Hematology negative hematology ROS (+)   Anesthesia Other Findings   Reproductive/Obstetrics negative OB ROS                             Anesthesia Physical Anesthesia Plan  ASA: 2  Anesthesia Plan: General   Post-op Pain Management:    Induction:   PONV Risk Score and Plan: Propofol infusion  Airway Management Planned:   Additional Equipment:   Intra-op Plan:   Post-operative Plan:   Informed Consent: I have reviewed the patients History and Physical, chart, labs and discussed the procedure including the risks, benefits and alternatives for the proposed anesthesia with the patient or authorized representative who has indicated his/her understanding and acceptance.     Dental Advisory Given  Plan Discussed with: CRNA  Anesthesia Plan Comments:         Anesthesia Quick Evaluation  

## 2021-10-29 ENCOUNTER — Encounter (INDEPENDENT_AMBULATORY_CARE_PROVIDER_SITE_OTHER): Payer: Self-pay | Admitting: *Deleted

## 2021-10-29 LAB — SURGICAL PATHOLOGY

## 2021-11-04 ENCOUNTER — Encounter (HOSPITAL_COMMUNITY): Payer: Self-pay | Admitting: Internal Medicine

## 2021-12-20 DIAGNOSIS — M25572 Pain in left ankle and joints of left foot: Secondary | ICD-10-CM | POA: Diagnosis not present

## 2021-12-23 ENCOUNTER — Ambulatory Visit
Admission: EM | Admit: 2021-12-23 | Discharge: 2021-12-23 | Disposition: A | Discharge status: Home / Self Care | Payer: 59 | Attending: Family Medicine | Admitting: Family Medicine

## 2021-12-23 ENCOUNTER — Ambulatory Visit: Payer: Self-pay

## 2021-12-23 DIAGNOSIS — N390 Urinary tract infection, site not specified: Secondary | ICD-10-CM | POA: Diagnosis not present

## 2021-12-23 LAB — POCT URINALYSIS DIP (MANUAL ENTRY)
Bilirubin, UA: NEGATIVE
Glucose, UA: NEGATIVE mg/dL
Ketones, POC UA: NEGATIVE mg/dL
Nitrite, UA: POSITIVE — AB
Protein Ur, POC: 30 mg/dL — AB
Spec Grav, UA: 1.015 (ref 1.010–1.025)
Urobilinogen, UA: 0.2 E.U./dL
pH, UA: 6 (ref 5.0–8.0)

## 2021-12-23 MED ORDER — CEPHALEXIN 500 MG PO CAPS: 500.0000 mg | ORAL_CAPSULE | Freq: Two times a day (BID) | ORAL | 0 refills | Status: DC

## 2021-12-23 NOTE — ED Provider Notes (Signed)
[facility]    CSN: [id number] Arrival date & time: 12/23/21  1640      History   Chief Complaint Chief Complaint  Patient presents with   UTI    HPI Cheyenne Christensen is a 61 y.o. female.   Presenting today with several day history of urinary urgency, suprapubic pain and pressure, now this morning with some hematuria.  Denies fever, chills, nausea, vomiting, vaginal symptoms, back pain.  So far trying to drink more fluids without relief.    Past Medical History:  Diagnosis Date   Hypertension    SVD (spontaneous vaginal delivery)    x 1    Patient Active Problem List   Diagnosis Date Noted   Special screening for malignant neoplasms, colon 05/23/2018    Past Surgical History:  Procedure Laterality Date   CESAREAN SECTION  1987   COLONOSCOPY WITH PROPOFOL N/A 10/28/2021   Procedure: COLONOSCOPY WITH PROPOFOL;  Surgeon: Rogene Houston, MD;  Location: [facility];  Service: Endoscopy;  Laterality: N/A;  [employer] N/A 04/16/2013   Procedure: DILATATION & CURETTAGE/HYSTEROSCOPY;  Surgeon: Arloa Koh, MD;  Location: [facility];  Service: Gynecology;  Laterality: N/A;   POLYPECTOMY  10/28/2021   Procedure: POLYPECTOMY;  Surgeon: Rogene Houston, MD;  Location: [facility];  Service: Endoscopy;;    OB History     Gravida  2   Para  2   Term  2   Preterm      AB      Living  2      SAB      IAB      Ectopic      Multiple      Live Births  2            Home Medications    Prior to Admission medications   Medication Sig Start Date End Date Taking? Authorizing Provider  cephALEXin (KEFLEX) 500 MG capsule Take 1 capsule (500 mg total) by mouth 2 (two) times daily. 12/23/21  Yes Volney American, PA-C  acetaminophen (TYLENOL) 500 MG tablet Take 1,000 mg by mouth every 8 (eight) hours as needed for pain.    [provider]  amLODipine (NORVASC) 10 MG tablet Take 10 mg by  mouth daily. 04/18/19   [provider]  Calcium Carb-Cholecalciferol (CALCIUM 600/VITAMIN D3 PO) Take 1 tablet by mouth daily.    [provider]  ibuprofen (ADVIL) 200 MG tablet Take 400-600 mg by mouth every 8 (eight) hours as needed (pain).    [provider]    Family History Family History  Problem Relation Age of Onset   Hypertension Mother    Deep vein thrombosis Mother    Thyroid disease Mother    Osteoporosis Mother    Depression Mother    Anxiety disorder Mother    Multiple myeloma Mother        Dec multiple myeloma age 55   Thyroid disease Sister    Anxiety disorder Sister    Depression Sister    Cancer Father        non Hodgkin's lymphoma    Social History Social History   Tobacco Use   Smoking status: Never   Smokeless tobacco: Never  Vaping Use   Vaping Use: Never used  Substance Use Topics   Alcohol use: No   Drug use: No     Allergies   Lisinopril and Sulfa antibiotics  Review of Systems Review of Systems Per HPI  Physical Exam Triage Vital Signs ED Triage Vitals  Enc Vitals Group     BP 12/23/21 1651 (!) 139/94     Pulse Rate 12/23/21 1651 69     Resp 12/23/21 1651 18     Temp 12/23/21 1651 98.4 F (36.9 C)     Temp Source 12/23/21 1651 Oral     SpO2 12/23/21 1651 97 %     Weight --      Height --      Head Circumference --      Peak Flow --      Pain Score 12/23/21 1652 6     Pain Loc --      Pain Edu? --      Excl. in [place]? --    No data found.  Updated Vital Signs BP (!) 139/94 (BP Location: Right Arm)   Pulse 69   Temp 98.4 F (36.9 C) (Oral)   Resp 18   LMP 08/18/2013   SpO2 97%   Visual Acuity Right Eye Distance:   Left Eye Distance:   Bilateral Distance:    Right Eye Near:   Left Eye Near:    Bilateral Near:     Physical Exam Vitals and nursing note reviewed.  Constitutional:      Appearance: Normal appearance. She is not ill-appearing.  HENT:     Head: Atraumatic.      Mouth/Throat:     Mouth: Mucous membranes are moist.  Eyes:     Extraocular Movements: Extraocular movements intact.     Conjunctiva/sclera: Conjunctivae normal.  Cardiovascular:     Rate and Rhythm: Normal rate and regular rhythm.     Heart sounds: Normal heart sounds.  Pulmonary:     Effort: Pulmonary effort is normal.     Breath sounds: Normal breath sounds.  Abdominal:     General: Bowel sounds are normal. There is no distension.     Palpations: Abdomen is soft.     Tenderness: There is no abdominal tenderness. There is no right CVA tenderness, left CVA tenderness or guarding.  Musculoskeletal:        General: Normal range of motion.     Cervical back: Normal range of motion and neck supple.  Skin:    General: Skin is warm and dry.  Neurological:     Mental Status: She is alert and oriented to person, place, and time.  Psychiatric:        Mood and Affect: Mood normal.        Thought Content: Thought content normal.        Judgment: Judgment normal.      UC Treatments / Results  Labs (all labs ordered are listed, but only abnormal results are displayed) Labs Reviewed  POCT URINALYSIS DIP (MANUAL ENTRY) - Abnormal; Notable for the following components:      Result Value   Clarity, UA cloudy (*)    Blood, UA large (*)    Protein Ur, POC =30 (*)    Nitrite, UA Positive (*)    Leukocytes, UA Large (3+) (*)    All other components within normal limits  URINE CULTURE    EKG   Radiology No results found.  Procedures Procedures (including critical care time)  Medications Ordered in UC Medications - No data to display  Initial Impression / Assessment and Plan / UC Course  I have reviewed the triage vital signs and the nursing notes.  Pertinent labs & imaging results that were available during my care of the patient were reviewed by me and considered in my medical decision making (see chart for details).     Urinalysis positive for urinary tract infection,  urine culture pending, treat with Keflex, fluids, Azo as needed while awaiting results and adjust if needed.  Return for worsening symptoms.  Final Clinical Impressions(s) / UC Diagnoses   Final diagnoses:  Acute lower UTI   Discharge Instructions   None    ED Prescriptions     Medication Sig Dispense Auth. Provider   cephALEXin (KEFLEX) 500 MG capsule Take 1 capsule (500 mg total) by mouth 2 (two) times daily. 10 capsule Volney American, [place]      PDMP not reviewed this encounter.   Volney American, [place] 12/23/21 1757

## 2021-12-23 NOTE — ED Triage Notes (Signed)
Pt presents with urinary urgency and pain during urination for past few days.

## 2021-12-26 LAB — URINE CULTURE: Culture: >=100000 — AB

## 2022-02-01 ENCOUNTER — Other Ambulatory Visit (HOSPITAL_COMMUNITY): Payer: Self-pay | Admitting: Adult Health Nurse Practitioner

## 2022-02-01 DIAGNOSIS — Z1231 Encounter for screening mammogram for malignant neoplasm of breast: Secondary | ICD-10-CM

## 2022-02-03 ENCOUNTER — Ambulatory Visit
Admission: RE | Admit: 2022-02-03 | Discharge: 2022-02-03 | Disposition: A | Discharge status: Home / Self Care | Payer: 59 | Source: Ambulatory Visit | Attending: Family Medicine | Admitting: Family Medicine

## 2022-02-03 ENCOUNTER — Other Ambulatory Visit: Payer: Self-pay

## 2022-02-03 VITALS — BP 155/94 | HR 80 | Temp 98.6°F | Resp 20

## 2022-02-03 DIAGNOSIS — R002 Palpitations: Secondary | ICD-10-CM

## 2022-02-03 DIAGNOSIS — R5383 Other fatigue: Secondary | ICD-10-CM | POA: Diagnosis not present

## 2022-02-03 NOTE — Discharge Instructions (Signed)
You have had labs (blood work) drawn today. We will call you with any significant abnormalities or if there is need to begin or change treatment or pursue further follow up.  You may also review your test results online through MyChart. If you do not have a MyChart account, instructions to sign up should be on your discharge paperwork.  

## 2022-02-03 NOTE — ED Triage Notes (Signed)
Pt reports fatigue, palpitations, anxiousness, generalized cramps this am. Pt reports episode did not last long but reports intermittent episodes of similar last several weeks. Pt denies shortness of breath, chest pain at this time. Nad noted.

## 2022-02-04 LAB — COMPREHENSIVE METABOLIC PANEL
ALT: 14 IU/L (ref 0–32)
AST: 18 IU/L (ref 0–40)
Albumin/Globulin Ratio: 1.9 (ref 1.2–2.2)
Albumin: 4.5 g/dL (ref 3.8–4.9)
Alkaline Phosphatase: 91 IU/L (ref 44–121)
BUN/Creatinine Ratio: 17 (ref 9–23)
BUN: 12 mg/dL (ref 6–24)
Bilirubin Total: 0.3 mg/dL (ref 0.0–1.2)
CO2: 21 mmol/L (ref 20–29)
Calcium: 9 mg/dL (ref 8.7–10.2)
Chloride: 102 mmol/L (ref 96–106)
Creatinine, Ser: 0.7 mg/dL (ref 0.57–1.00)
Globulin, Total: 2.4 g/dL (ref 1.5–4.5)
Glucose: 92 mg/dL (ref 70–99)
Potassium: 4.1 mmol/L (ref 3.5–5.2)
Sodium: 138 mmol/L (ref 134–144)
Total Protein: 6.9 g/dL (ref 6.0–8.5)
eGFR: 100 mL/min/{1.73_m2} (ref >59)

## 2022-02-04 LAB — CBC
Hematocrit: 44.8 % (ref 34.0–46.6)
Hemoglobin: 14.5 g/dL (ref 11.1–15.9)
MCH: 29.2 pg (ref 26.6–33.0)
MCHC: 32.4 g/dL (ref 31.5–35.7)
MCV: 90 fL (ref 79–97)
Platelets: 273 10*3/uL (ref 150–450)
RBC: 4.97 x10E6/uL (ref 3.77–5.28)
RDW: 12.8 % (ref 11.7–15.4)
WBC: 5.8 10*3/uL (ref 3.4–10.8)

## 2022-02-04 LAB — TSH: TSH: 2.07 u[IU]/mL (ref 0.450–4.500)

## 2022-02-04 LAB — VITAMIN B12: Vitamin B-12: 308 pg/mL (ref 232–1245)

## 2022-02-04 LAB — VITAMIN D 25 HYDROXY (VIT D DEFICIENCY, FRACTURES): Vit D, 25-Hydroxy: 28 ng/mL — ABNORMAL LOW (ref 30.0–100.0)

## 2022-02-08 NOTE — ED Provider Notes (Signed)
Cheyenne Christensen   [id number] 02/03/22 Arrival Time: 9024  ASSESSMENT & PLAN:  1. Palpitations   2. Other fatigue    ECG: NSR. No acute changes. Interpreted by me. Unclear etiology of current symptoms. Understands importance of PCP f/u. Pending:  VITAMIN D 25 HYDROXY  CBC  COMPREHENSIVE METABOLIC PANEL  TSH  VITAMIN B12   Recommend:  Follow-up Information     Schedule an appointment as soon as possible for a visit  with Pablo Lawrence, NP.   Specialty: Adult Health Nurse Practitioner Contact information: [address] [place] [address] [phone]                Reviewed expectations re: course of current medical issues. Questions answered. Outlined signs and symptoms indicating need for more acute intervention. Understanding verbalized. After Visit Summary given.   SUBJECTIVE: History from: Patient. Cheyenne Christensen is a 61 y.o. female. Pt reports fatigue, palpitations, anxiousness, generalized cramps this am. Pt reports episode did not last long but reports intermittent episodes of similar last several weeks. Pt denies shortness of breath, chest pain at this time. "Just feel really tired lately; a lot of stress". Normal PO intake without n/v/d.  OBJECTIVE:  Vitals:   02/03/22 1620  BP: (!) 155/94  Pulse: 80  Resp: 20  Temp: 98.6 F (37 C)  TempSrc: Oral  SpO2: 97%    General appearance: alert; no distress Eyes: PERRLA; EOMI; conjunctiva normal HENT: [place]; AT; without nasal congestion Neck: supple  Lungs: speaks full sentences without difficulty; unlabored CV: RRR Extremities: no edema Skin: warm and dry Neurologic: normal gait Psychological: alert and cooperative; normal mood and affect   Allergies  Allergen Reactions   Lisinopril Rash   Sulfa Antibiotics Rash    Past Medical History:  Diagnosis Date   Hypertension    SVD (spontaneous vaginal delivery)    x 1   Social History   Socioeconomic History    Marital status: Married    Spouse name: Not on file   Number of children: Not on file   Years of education: Not on file   Highest education level: Not on file  Occupational History   Not on file  Tobacco Use   Smoking status: Never   Smokeless tobacco: Never  Vaping Use   Vaping Use: Never used  Substance and Sexual Activity   Alcohol use: No   Drug use: No   Sexual activity: Yes    Partners: Male    Birth control/protection: None    Comment: Husband Has Vasectomy  Other Topics Concern   Not on file  Social History Narrative   Not on file   Social Determinants of Health   Financial Resource Strain: Not on file  Food Insecurity: Not on file  Transportation Needs: Not on file  Physical Activity: Not on file  Stress: Not on file  Social Connections: Not on file  Intimate Partner Violence: Not on file   Family History  Problem Relation Age of Onset   Hypertension Mother    Deep vein thrombosis Mother    Thyroid disease Mother    Osteoporosis Mother    Depression Mother    Anxiety disorder Mother    Multiple myeloma Mother        Dec multiple myeloma age 17   Thyroid disease Sister    Anxiety disorder Sister    Depression Sister    Cancer Father        non  Hodgkin's lymphoma   Past Surgical History:  Procedure Laterality Date   CESAREAN SECTION  1987   COLONOSCOPY WITH PROPOFOL N/A 10/28/2021   Procedure: COLONOSCOPY WITH PROPOFOL;  Surgeon: Rogene Houston, MD;  Location[facility];  Service: Endoscopy;  Laterality: N/A;  [place] N/A 04/16/2013   Procedure: DILATATION & CURETTAGE/HYSTEROSCOPY;  Surgeon: Arloa Koh, MD;  Location: [facility];  Service: Gynecology;  Laterality: N/A;   POLYPECTOMY  10/28/2021   Procedure: POLYPECTOMY;  Surgeon: Rogene Houston, MD;  Location: [facility];  Service: Endoscopy;;     Vanessa Kick, MD 02/08/22 1315

## 2022-02-14 DIAGNOSIS — E538 Deficiency of other specified B group vitamins: Secondary | ICD-10-CM | POA: Insufficient documentation

## 2022-02-16 ENCOUNTER — Other Ambulatory Visit (HOSPITAL_COMMUNITY): Payer: Self-pay | Admitting: Adult Health Nurse Practitioner

## 2022-02-16 ENCOUNTER — Other Ambulatory Visit: Payer: Self-pay | Admitting: Adult Health Nurse Practitioner

## 2022-02-16 DIAGNOSIS — M79662 Pain in left lower leg: Secondary | ICD-10-CM

## 2022-02-18 ENCOUNTER — Ambulatory Visit (HOSPITAL_COMMUNITY)
Admission: RE | Admit: 2022-02-18 | Discharge: 2022-02-18 | Disposition: A | Discharge status: Home / Self Care | Payer: 59 | Source: Ambulatory Visit | Attending: Adult Health Nurse Practitioner | Admitting: Adult Health Nurse Practitioner

## 2022-02-18 DIAGNOSIS — Z1231 Encounter for screening mammogram for malignant neoplasm of breast: Secondary | ICD-10-CM | POA: Diagnosis not present

## 2022-02-18 DIAGNOSIS — M79662 Pain in left lower leg: Secondary | ICD-10-CM

## 2022-04-04 ENCOUNTER — Ambulatory Visit (INDEPENDENT_AMBULATORY_CARE_PROVIDER_SITE_OTHER): Payer: 59 | Admitting: Pulmonary Disease

## 2022-04-04 ENCOUNTER — Encounter: Payer: Self-pay | Admitting: Pulmonary Disease

## 2022-04-04 VITALS — BP 128/72 | HR 78 | Temp 97.8°F | Ht 62.0 in | Wt 185.0 lb

## 2022-04-04 DIAGNOSIS — R0683 Snoring: Secondary | ICD-10-CM | POA: Diagnosis not present

## 2022-04-04 NOTE — Progress Notes (Signed)
Subjective:    Patient ID: Cheyenne Christensen, female    DOB: 10/29/1960, 61 y.o.   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 She reports loud snoring noted by her husband especially on her back and he has also witnessed apneas. She feels sluggish especially in the afternoons. Epworth sleepiness score is 9 and she reports some sleepiness while watching TV, as a passenger in a car or lying down to rest in the afternoon. Bedtime is between 10 and 11 PM, sleep latency about 30 minutes, she sleeps on her back but rolls over on her side especially when she wakes herself up sometimes.  Reports 2-3 nocturnal awakenings including nocturia and is out of bed at 6:30 AM after hitting the snooze button a few times, occasionally with morning headaches and with dryness of mouth.  She admits to being a mouth breather. Her weight is unchanged over the last 2 years Blood pressure controlled with 1 medication  There is no history suggestive of cataplexy, sleep paralysis or parasomnias     Past Medical History:  Diagnosis Date   Hypertension    SVD (spontaneous vaginal delivery)    x 1    Past Surgical History:  Procedure Laterality Date   CESAREAN SECTION  1987   COLONOSCOPY WITH PROPOFOL N/A 10/28/2021   Procedure: COLONOSCOPY WITH PROPOFOL;  Surgeon: Rogene Houston, MD;  [facility];  Service: Endoscopy;  Laterality: N/A;  [place] N/A 04/16/2013   Procedure: DILATATION & CURETTAGE/HYSTEROSCOPY;  Surgeon: Arloa Koh, MD;  Location: [facility];  Service: Gynecology;  Laterality: N/A;   POLYPECTOMY  10/28/2021   Procedure: POLYPECTOMY;  Surgeon: Rogene Houston, MD;  [facility];  Service: Endoscopy;;    Allergies  Allergen Reactions   Lisinopril Rash   Sulfa Antibiotics Rash    Social History   Socioeconomic History   Marital  status: Married    Spouse name: Not on file   Number of children: Not on file   Years of education: Not on file   Highest education level: Not on file  Occupational History   Not on file  Tobacco Use   Smoking status: Never   Smokeless tobacco: Never  Vaping Use   Vaping Use: Never used  Substance and Sexual Activity   Alcohol use: No   Drug use: No   Sexual activity: Yes    Partners: Male    Birth control/protection: None    Comment: Husband Has Vasectomy  Other Topics Concern   Not on file  Social History Narrative   Not on file   Social Determinants of Health   Financial Resource Strain: Not on file  Food Insecurity: Not on file  Transportation Needs: Not on file  Physical Activity: Not on file  Stress: Not on file  Social Connections: Not on file  Intimate Partner Violence: Not on file    Family History  Problem Relation Age of Onset   Hypertension Mother    Deep vein thrombosis Mother    Thyroid disease Mother    Osteoporosis Mother    Depression Mother    Anxiety disorder Mother    Multiple myeloma Mother        Dec multiple myeloma age 31   Thyroid disease Sister    Anxiety disorder Sister    Depression Sister    Cancer Father  non Hodgkin's lymphoma     Review of Systems Constitutional: negative for anorexia, fevers and sweats  Eyes: negative for irritation, redness and visual disturbance  Ears, nose, mouth, throat, and face: negative for earaches, epistaxis, nasal congestion and sore throat  Respiratory: negative for cough, dyspnea on exertion, sputum and wheezing  Cardiovascular: negative for chest pain, dyspnea, lower extremity edema, orthopnea, palpitations and syncope  Gastrointestinal: negative for abdominal pain, constipation, diarrhea, melena, nausea and vomiting  Genitourinary:negative for dysuria, frequency and hematuria  Hematologic/lymphatic: negative for bleeding, easy bruising and lymphadenopathy  Musculoskeletal:negative for  arthralgias, muscle weakness and stiff joints  Neurological: negative for coordination problems, gait problems, headaches and weakness  Endocrine: negative for diabetic symptoms including polydipsia, polyuria and weight loss     Objective:   Physical Exam  Gen. Pleasant, obese, in no distress, normal affect ENT - no pallor,icterus, no post nasal drip, class 2 airway Neck: No JVD, no thyromegaly, no carotid bruits Lungs: no use of accessory muscles, no dullness to percussion, decreased without rales or rhonchi  Cardiovascular: Rhythm regular, heart sounds  normal, no murmurs or gallops, no peripheral edema Abdomen: soft and non-tender, no hepatosplenomegaly, BS normal. Musculoskeletal: No deformities, no cyanosis or clubbing Neuro:  alert, non focal, no tremors       Assessment & Plan:

## 2022-04-04 NOTE — Assessment & Plan Note (Signed)
Given excessive daytime somnolence, narrow pharyngeal exam, witnessed apneas & loud snoring, obstructive sleep apnea is very likely & an overnight polysomnogram will be scheduled as a home study. The pathophysiology of obstructive sleep apnea , it's cardiovascular consequences & modes of treatment including CPAP were discused with the patient in detail & they evidenced understanding.  Pretest probability is intermediate.  We will only treat if she has more than mild OSA since she has a good cardiovascular profile. She would be a good candidate for oral appliance but would be willing to use CPAP if needed

## 2022-04-04 NOTE — Patient Instructions (Signed)
Home sleep test 

## 2022-06-08 ENCOUNTER — Ambulatory Visit: Payer: 59

## 2022-06-08 DIAGNOSIS — R0683 Snoring: Secondary | ICD-10-CM

## 2022-06-08 DIAGNOSIS — G4733 Obstructive sleep apnea (adult) (pediatric): Secondary | ICD-10-CM

## 2022-06-10 ENCOUNTER — Telehealth: Payer: Self-pay | Admitting: Pulmonary Disease

## 2022-06-10 DIAGNOSIS — G4733 Obstructive sleep apnea (adult) (pediatric): Secondary | ICD-10-CM | POA: Diagnosis not present

## 2022-06-10 DIAGNOSIS — R0683 Snoring: Secondary | ICD-10-CM

## 2022-06-10 NOTE — Telephone Encounter (Signed)
Surprisingly, HST showed severe OSA with AHI 80/h & low sat 75% Suggest, autoCPAP 5-15 cm, mask of choice FU OV 6 wks after starting

## 2022-06-14 NOTE — Telephone Encounter (Signed)
Spoke with patient  regarding HST results. They verbalized understanding. No further questions.  Nothing further needed at this time.   

## 2022-06-17 ENCOUNTER — Telehealth: Payer: Self-pay | Admitting: Pulmonary Disease

## 2022-06-21 NOTE — Telephone Encounter (Signed)
Order sent to [facility] at 10:36am 06/21/2022

## 2022-06-21 NOTE — Telephone Encounter (Signed)
pt called to provide fax for cpap order to be sent [facility] fax # [phone]     [phone]  Incoming call    PCCs please advise, can we send patients CPAP order to [place]?

## 2022-06-23 DIAGNOSIS — G4733 Obstructive sleep apnea (adult) (pediatric): Secondary | ICD-10-CM | POA: Diagnosis not present

## 2022-07-24 DIAGNOSIS — G4733 Obstructive sleep apnea (adult) (pediatric): Secondary | ICD-10-CM | POA: Diagnosis not present

## 2022-08-22 DIAGNOSIS — G4733 Obstructive sleep apnea (adult) (pediatric): Secondary | ICD-10-CM | POA: Diagnosis not present

## 2022-08-25 DIAGNOSIS — M5451 Vertebrogenic low back pain: Secondary | ICD-10-CM | POA: Diagnosis not present

## 2022-09-08 ENCOUNTER — Ambulatory Visit: Payer: 59 | Admitting: Orthopedic Surgery

## 2022-09-08 ENCOUNTER — Other Ambulatory Visit (INDEPENDENT_AMBULATORY_CARE_PROVIDER_SITE_OTHER): Payer: 59

## 2022-09-08 VITALS — BP 128/72 | HR 62 | Ht 63.0 in | Wt 184.0 lb

## 2022-09-08 DIAGNOSIS — M5442 Lumbago with sciatica, left side: Secondary | ICD-10-CM

## 2022-09-08 DIAGNOSIS — G8929 Other chronic pain: Secondary | ICD-10-CM

## 2022-09-08 NOTE — Progress Notes (Signed)
Chief Complaint  Patient presents with   Back Pain    Patient ID: Cheyenne Christensen, female   DOB: July 14, 1960, 62 y.o.   MRN: [id number]    ASSESSMENT AND PLAN:  62 year old female had a history of back pain and scoliosis about 8 years ago presents now with new onset back pain radicular symptoms all the way to the foot numbness and tingling excessive burning in the thigh is failed steroid and gabapentin therapy with progressive disease on x-ray.  My advice and treatment plan is for her to see neurosurgery this may require MRI prior to neurosurgical visit.  In the meantime she can titrate her gabapentin up to is much as 600 mg at bedtime to help with pain relief  Chief Complaint  Patient presents with   Back Pain    HPI Cheyenne Christensen is a 62 y.o. female.  62 year old female previous history of back pain previous history of scoliosis over 5 years ago presents with acute onset of back pain and radicular symptoms in the left leg radiating down to the left foot.  She went to see [facility] urgent care who gave her a steroid Dosepak and some gabapentin she has not improved  Although the pain radiating to the foot has improved the thigh pain is still there still fairly significant and is really a burning sensation.  She notes that she has difficulty standing and 1.  For long time she gets relief by sitting.  Allergies  Allergen Reactions   Lisinopril Rash   Sulfa Antibiotics Rash   Current Outpatient Medications  Medication Instructions   acetaminophen (TYLENOL) 1,000 mg, Oral, Every 8 hours PRN   amLODipine (NORVASC) 10 mg, Oral, Daily   Calcium Carb-Cholecalciferol (CALCIUM 600/VITAMIN D3 PO) 1 tablet, Oral, Daily   cyanocobalamin (VITAMIN B12) 1000 MCG/ML injection Subcutaneous   gabapentin (NEURONTIN) 100 mg, Oral, Daily at bedtime   ibuprofen (ADVIL) 400-600 mg, Every 8 hours PRN    Review of Systems Review of Systems  Constitutional:  Negative for fever and unexpected  weight change.  Gastrointestinal: Negative.   Genitourinary: Negative.   Neurological:  Negative for numbness.    Past Medical History:  Diagnosis Date   Hypertension    SVD (spontaneous vaginal delivery)    x 1    Past Surgical History:  Procedure Laterality Date   CESAREAN SECTION  1987   COLONOSCOPY WITH PROPOFOL N/A 10/28/2021   Procedure: COLONOSCOPY WITH PROPOFOL;  Surgeon: Rogene Houston, MD;  [facility];  Service: Endoscopy;  Laterality: N/A;  [place] N/A 04/16/2013   Procedure: DILATATION & CURETTAGE/HYSTEROSCOPY;  Surgeon: Arloa Koh, MD;  Location: [facility];  Service: Gynecology;  Laterality: N/A;   POLYPECTOMY  10/28/2021   Procedure: POLYPECTOMY;  Surgeon: Rogene Houston, MD;  [facility];  Service: Endoscopy;;    Family History  Problem Relation Age of Onset   Hypertension Mother    Deep vein thrombosis Mother    Thyroid disease Mother    Osteoporosis Mother    Depression Mother    Anxiety disorder Mother    Multiple myeloma Mother        Dec multiple myeloma age 55   Thyroid disease Sister    Anxiety disorder Sister    Depression Sister    Cancer Father        non Hodgkin's lymphoma   was reviewed  Social History Social History   Tobacco Use  Smoking status: Never   Smokeless tobacco: Never  Vaping Use   Vaping Use: Never used  Substance Use Topics   Alcohol use: No   Drug use: No    Allergies  Allergen Reactions   Lisinopril Rash   Sulfa Antibiotics Rash    Current Outpatient Medications  Medication Sig Dispense Refill   acetaminophen (TYLENOL) 500 MG tablet Take 1,000 mg by mouth every 8 (eight) hours as needed for pain.     amLODipine (NORVASC) 10 MG tablet Take 10 mg by mouth daily.     Calcium Carb-Cholecalciferol (CALCIUM 600/VITAMIN D3 PO) Take 1 tablet by mouth daily.     cyanocobalamin (VITAMIN B12) 1000 MCG/ML injection Inject into the skin.      gabapentin (NEURONTIN) 100 MG capsule Take 100 mg by mouth at bedtime.     ibuprofen (ADVIL) 200 MG tablet Take 400-600 mg by mouth every 8 (eight) hours as needed (pain). (Patient not taking: Reported on 09/08/2022)     No current facility-administered medications for this visit.       Physical Exam BP 128/72   Pulse 62   Ht 5\' 3"  (1.6 m)   Wt 184 lb (83.5 kg)   LMP 08/18/2013   BMI 32.59 kg/m   Gen. appearance: The patient is well-developed and well-nourished grooming and hygiene are normal The patient is oriented to person place and time The patient's mood is normal and the affect is normal   Gait assessment: The patient stands with  normal gait and station  Lumbar spine Tenderness  to palpation is noted in the lower L4-5 and 5 S1 segment  Range of motion she flexes easily but has pain with extension Muscle tone    normal on the right and left sides of the spine  Lower extremities right and left Normal range of motion hip knee and ankle All 3 joints are reduced and stable  Strength right lower extremity L2-S1 NORMA Strength left lower extremity L2-S1 NORMAL EXCEPT   Neurologic right lower extremity examination  Reflexes were 2+ and equal at the knee and 1+ and equal at the ankle    Sensation was normal in both feet and legs    Babinski's tests were down going  Straight leg raise testing   The vascular examination revealed normal dorsalis pedis pulses in both feet and both feet were warm with good capillary refill    MEDICAL DECISION MAKING  A.  Encounter Diagnosis  Name Primary?   Chronic left-sided low back pain with left-sided sciatica Yes    B. 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  Her new x-rays show progression of the scoliosis worsening of the degenerative disc disease  IMAGING: Independent interpretation of images: As above  Orders: No new orders  Outside  records reviewed:   C. MANAGEMENT as above  No orders of the defined types were placed in this encounter.   [facility] urgent [facility] on 1st week of march   Steroid taper and gabapentin  Previous MRI MRI lumbar     FINDINGS: Five lumbar type vertebral bodies. Mild convex left lumbar spine curvature. Sacroiliac joints are symmetric. Maintenance of vertebral body height and alignment. Intervertebral disc heights are maintained. Facet arthropathy including at L4-5 and L5-S1. Mild endplate osteophytes including at L1-2.   IMPRESSION: Lumbar spondylosis, without acute osseous abnormality.     Electronically Signed   By: Marylyn Ishihara  Jobe Igo M.D.   On: 07/27/2015 14:20

## 2022-09-08 NOTE — Patient Instructions (Signed)
While we are working on your approval for MRI please go ahead and call to schedule your appointment with [facility] within at least one (1) week.   Central Scheduling [phone]  

## 2022-09-20 ENCOUNTER — Telehealth: Payer: Self-pay | Admitting: Orthopaedic Surgery

## 2022-09-20 DIAGNOSIS — G8929 Other chronic pain: Secondary | ICD-10-CM

## 2022-09-20 NOTE — Telephone Encounter (Signed)
I called patient to let her know [facility] denied the MRI She has to have 6 weeks conservative care including physical therapy Sent therapy orders  To you FYI

## 2022-09-22 ENCOUNTER — Other Ambulatory Visit: Payer: Self-pay | Admitting: Orthopedic Surgery

## 2022-09-22 DIAGNOSIS — G4733 Obstructive sleep apnea (adult) (pediatric): Secondary | ICD-10-CM | POA: Diagnosis not present

## 2022-09-22 MED ORDER — GABAPENTIN 100 MG PO CAPS: 100.0000 mg | ORAL_CAPSULE | Freq: Every day | ORAL | 1 refills | Status: DC

## 2022-09-22 NOTE — Telephone Encounter (Signed)
Dr. Ruthe Mannan pt - spoke w/the patient, she stated that Dr. Aline Brochure has never prescribed Gabapentin for her, but he had advised her to continue taking it.  She would like for Dr. Aline Brochure to send it in to [facility].  She stated the 100mg  is working great.

## 2022-10-20 DIAGNOSIS — G4733 Obstructive sleep apnea (adult) (pediatric): Secondary | ICD-10-CM | POA: Diagnosis not present

## 2022-10-22 DIAGNOSIS — G4733 Obstructive sleep apnea (adult) (pediatric): Secondary | ICD-10-CM | POA: Diagnosis not present

## 2022-11-17 ENCOUNTER — Telehealth: Payer: 59 | Admitting: Physician Assistant

## 2022-11-17 DIAGNOSIS — B9689 Other specified bacterial agents as the cause of diseases classified elsewhere: Secondary | ICD-10-CM

## 2022-11-17 DIAGNOSIS — J208 Acute bronchitis due to other specified organisms: Secondary | ICD-10-CM | POA: Diagnosis not present

## 2022-11-17 MED ORDER — AZITHROMYCIN 250 MG PO TABS: ORAL_TABLET | ORAL | 0 refills | Status: AC

## 2022-11-17 MED ORDER — BENZONATATE 100 MG PO CAPS: 100.0000 mg | ORAL_CAPSULE | Freq: Three times a day (TID) | ORAL | 0 refills | Status: DC | PRN

## 2022-11-17 NOTE — Progress Notes (Signed)

## 2022-11-17 NOTE — Progress Notes (Signed)
I have spent 5 minutes in review of e-visit questionnaire, review and updating patient chart, medical decision making and response to patient.   Pacey Altizer Cody Frazer [place], PA-C    

## 2022-11-22 DIAGNOSIS — G4733 Obstructive sleep apnea (adult) (pediatric): Secondary | ICD-10-CM | POA: Diagnosis not present

## 2022-12-22 DIAGNOSIS — G4733 Obstructive sleep apnea (adult) (pediatric): Secondary | ICD-10-CM | POA: Diagnosis not present

## 2023-01-22 DIAGNOSIS — G4733 Obstructive sleep apnea (adult) (pediatric): Secondary | ICD-10-CM | POA: Diagnosis not present

## 2023-01-25 DIAGNOSIS — D513 Other dietary vitamin B12 deficiency anemia: Secondary | ICD-10-CM | POA: Diagnosis not present

## 2023-01-25 DIAGNOSIS — D509 Iron deficiency anemia, unspecified: Secondary | ICD-10-CM | POA: Diagnosis not present

## 2023-01-25 DIAGNOSIS — R7301 Impaired fasting glucose: Secondary | ICD-10-CM | POA: Diagnosis not present

## 2023-01-25 DIAGNOSIS — E785 Hyperlipidemia, unspecified: Secondary | ICD-10-CM | POA: Diagnosis not present

## 2023-01-25 DIAGNOSIS — I10 Essential (primary) hypertension: Secondary | ICD-10-CM | POA: Diagnosis not present

## 2023-01-25 DIAGNOSIS — E669 Obesity, unspecified: Secondary | ICD-10-CM | POA: Diagnosis not present

## 2023-01-25 DIAGNOSIS — E611 Iron deficiency: Secondary | ICD-10-CM | POA: Diagnosis not present

## 2023-02-08 ENCOUNTER — Other Ambulatory Visit (HOSPITAL_COMMUNITY): Payer: Self-pay | Admitting: Adult Health Nurse Practitioner

## 2023-02-08 DIAGNOSIS — Z1231 Encounter for screening mammogram for malignant neoplasm of breast: Secondary | ICD-10-CM

## 2023-02-22 ENCOUNTER — Ambulatory Visit (HOSPITAL_COMMUNITY)
Admission: RE | Admit: 2023-02-22 | Discharge: 2023-02-22 | Disposition: A | Discharge status: Home / Self Care | Payer: 59 | Source: Ambulatory Visit | Attending: Adult Health Nurse Practitioner | Admitting: Adult Health Nurse Practitioner

## 2023-02-22 DIAGNOSIS — Z1231 Encounter for screening mammogram for malignant neoplasm of breast: Secondary | ICD-10-CM | POA: Insufficient documentation

## 2023-02-22 DIAGNOSIS — G4733 Obstructive sleep apnea (adult) (pediatric): Secondary | ICD-10-CM | POA: Diagnosis not present

## 2023-03-24 DIAGNOSIS — G4733 Obstructive sleep apnea (adult) (pediatric): Secondary | ICD-10-CM | POA: Diagnosis not present

## 2023-04-19 ENCOUNTER — Other Ambulatory Visit (HOSPITAL_COMMUNITY)
Admission: RE | Admit: 2023-04-19 | Discharge: 2023-04-19 | Disposition: A | Discharge status: Home / Self Care | Payer: 59 | Source: Ambulatory Visit | Attending: Obstetrics and Gynecology | Admitting: Obstetrics and Gynecology

## 2023-04-19 ENCOUNTER — Telehealth: Payer: Self-pay | Admitting: *Deleted

## 2023-04-19 ENCOUNTER — Encounter: Payer: Self-pay | Admitting: Obstetrics and Gynecology

## 2023-04-19 ENCOUNTER — Ambulatory Visit (INDEPENDENT_AMBULATORY_CARE_PROVIDER_SITE_OTHER): Payer: 59 | Admitting: Obstetrics and Gynecology

## 2023-04-19 VITALS — BP 118/74 | HR 67 | Ht 62.0 in | Wt 181.2 lb

## 2023-04-19 DIAGNOSIS — N889 Noninflammatory disorder of cervix uteri, unspecified: Secondary | ICD-10-CM | POA: Insufficient documentation

## 2023-04-19 DIAGNOSIS — N888 Other specified noninflammatory disorders of cervix uteri: Secondary | ICD-10-CM | POA: Diagnosis not present

## 2023-04-19 DIAGNOSIS — E559 Vitamin D deficiency, unspecified: Secondary | ICD-10-CM | POA: Diagnosis not present

## 2023-04-19 DIAGNOSIS — N958 Other specified menopausal and perimenopausal disorders: Secondary | ICD-10-CM | POA: Insufficient documentation

## 2023-04-19 DIAGNOSIS — Z13820 Encounter for screening for osteoporosis: Secondary | ICD-10-CM

## 2023-04-19 DIAGNOSIS — Z01419 Encounter for gynecological examination (general) (routine) without abnormal findings: Secondary | ICD-10-CM | POA: Insufficient documentation

## 2023-04-19 DIAGNOSIS — Z124 Encounter for screening for malignant neoplasm of cervix: Secondary | ICD-10-CM

## 2023-04-19 NOTE — Progress Notes (Signed)
62 y.o. [id number] female here for annual exam. Married-vasectomy. 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  Patient's last menstrual period was 08/18/2013.  No complaints.  Abnormal bleeding: none Pelvic discharge or pain: none Breast mass, nipple discharge or skin changes : none Last PAP:     Component Value Date/Time   DIAGPAP  10/19/2017 0000    NEGATIVE FOR INTRAEPITHELIAL LESIONS OR MALIGNANCY.   ADEQPAP  10/19/2017 0000    Satisfactory for evaluation  endocervical/transformation zone component PRESENT.   Last mammogram: 02/22/23 BIRADS 1, density a Last colonoscopy: 10/28/21, +polyp Sexually active: yes  Exercising: some walking 2x a week  GYN HISTORY: No significant history  OB History  Gravida Para Term Preterm AB Living  2 2 2     2   SAB IAB Ectopic Multiple Live Births          2    # Outcome Date GA Lbr Len/2nd Weight Sex Type Anes PTL Lv  2 Term 1991 [redacted]w[redacted]d  8 lb 12 oz (3.969 kg) F Vag-Spont   LIV  1 Term 1987 [redacted]w[redacted]d  7 lb 15 oz (3.6 kg) F CS-Unspec   LIV    Past Medical History:  Diagnosis Date   Hypertension    SVD (spontaneous vaginal delivery)    x 1    Past Surgical History:  Procedure Laterality Date   CESAREAN SECTION  1987   COLONOSCOPY WITH PROPOFOL N/A 10/28/2021   Procedure: COLONOSCOPY WITH PROPOFOL;  Surgeon: Malissa Hippo, MD;  [facility];  Service: Endoscopy;  Laterality: N/A;  730   DILATATION & CURRETTAGE/HYSTEROSCOPY WITH RESECTOCOPE N/A 04/16/2013   Procedure: DILATATION & CURETTAGE/HYSTEROSCOPY;  Surgeon: Melony Overly, MD;  Location: [facility];  Service: Gynecology;  Laterality: N/A;   POLYPECTOMY  10/28/2021   Procedure: POLYPECTOMY;  Surgeon: Malissa Hippo, MD;  [facility];  Service: Endoscopy;;    Current Outpatient Medications on File Prior to Visit  Medication Sig Dispense Refill   acetaminophen (TYLENOL) 500 MG tablet Take 1,000 mg by mouth every 8 (eight) hours as needed for pain.     amLODipine  (NORVASC) 10 MG tablet Take 10 mg by mouth daily.     Calcium Carb-Cholecalciferol (CALCIUM 600/VITAMIN D3 PO) Take 1 tablet by mouth daily.     cyanocobalamin (VITAMIN B12) 1000 MCG/ML injection Inject into the skin.     ibuprofen (ADVIL) 200 MG tablet Take 400-600 mg by mouth every 8 (eight) hours as needed (pain).     No current facility-administered medications on file prior to visit.    Social History   Socioeconomic History   Marital status: Married    Spouse name: Not on file   Number of children: Not on file   Years of education: Not on file   Highest education level: Not on file  Occupational History   Not on file  Tobacco Use   Smoking status: Never   Smokeless tobacco: Never  Vaping Use   Vaping status: Never Used  Substance and Sexual Activity   Alcohol use: No   Drug use: No   Sexual activity: Yes    Partners: Male    Birth control/protection: Post-menopausal    Comment: Husband Has Vasectomy  Other Topics Concern   Not on file  Social History Narrative   Not on file   Social Determinants of Health   Financial Resource Strain: Low Risk  (12/26/2022)   Received from [facility]   Overall Financial Resource Strain (CARDIA)  Difficulty of Paying Living Expenses: Not hard at all  Food Insecurity: No Food Insecurity (12/26/2022)   Received from [facility]   Hunger Vital Sign    Worried About Running Out of Food in the Last Year: Never true    Ran Out of Food in the Last Year: Never true  Transportation Needs: No Transportation Needs (12/26/2022)   Received from [facility] - Transportation    Lack of Transportation (Medical): No    Lack of Transportation (Non-Medical): No  Physical Activity: Sufficiently Active (01/19/2021)   Received from [facility], [facility]   Exercise Vital Sign    Days of Exercise per Week: 5 days    Minutes of Exercise per Session: 30 min  Stress: No Stress Concern Present (01/19/2021)   Received from [facility], [facility] - Occupational Stress Questionnaire    Feeling of Stress : Only a little  Social Connections: Unknown (10/25/2021)   Received from [facility], [facility]   Social Network    Social Network: Not on file  Intimate Partner Violence: Unknown (09/24/2021)   Received from [facility], [facility]   HITS    Physically Hurt: Not on file    Insult or Talk Down To: Not on file    Threaten Physical Harm: Not on file    Scream or Curse: Not on file    Family History  Problem Relation Age of Onset   Hypertension Mother    Deep vein thrombosis Mother    Thyroid disease Mother    Osteoporosis Mother    Depression Mother    Anxiety disorder Mother    Multiple myeloma Mother        Dec multiple myeloma age 25   Cancer Father        non Hodgkin's lymphoma   Thyroid disease Sister    Anxiety disorder Sister    Depression Sister    Breast cancer Neg Hx    Ovarian cancer Neg Hx     Allergies  Allergen Reactions   Lisinopril Rash   Sulfa Antibiotics Rash      PE Today's Vitals   04/19/23 0926  BP: 118/74  Pulse: 67  SpO2: 97%  Weight: 181 lb 3.2 oz (82.2 kg)  Height: 5\' 2"  (1.575 m)   Body mass index is 33.14 kg/m.  Physical Exam Vitals reviewed. Exam conducted with a chaperone present.  Constitutional:      General: She is not in acute distress.    Appearance: Normal appearance.  HENT:     Head: Normocephalic and atraumatic.     Nose: Nose normal.  Eyes:     Extraocular Movements: Extraocular movements intact.     Conjunctiva/sclera: Conjunctivae normal.  Neck:     Thyroid: No thyroid mass, thyromegaly or thyroid tenderness.  Pulmonary:     Effort: Pulmonary effort is normal.  Chest:     Chest wall: No mass or tenderness.  Breasts:    Right: Normal. No swelling, mass, nipple discharge, skin change or tenderness.     Left: Normal. No swelling, mass, nipple discharge, skin change or tenderness.   Abdominal:     General: There is no distension.     Palpations: Abdomen is soft.     Tenderness: There is no abdominal tenderness.  Genitourinary:    General: Normal vulva.     Exam position: Lithotomy position.     Urethra: No prolapse.  Vagina: Normal. No vaginal discharge or bleeding.     Cervix: Normal. No lesion.     Uterus: Normal. Not enlarged and not tender.      Adnexa: Right adnexa normal and left adnexa normal.     Musculoskeletal:        General: Normal range of motion.     Cervical back: Normal range of motion.  Lymphadenopathy:     Upper Body:     Right upper body: No axillary adenopathy.     Left upper body: No axillary adenopathy.     Lower Body: No right inguinal adenopathy. No left inguinal adenopathy.  Skin:    General: Skin is warm and dry.  Neurological:     General: No focal deficit present.     Mental Status: She is alert.  Psychiatric:        Mood and Affect: Mood normal.        Behavior: Behavior normal.     Polypoid of cervix sampled with forceps. Silver nitrate applied for hemostasis.  Assessment and Plan:        Well woman exam with routine gynecological exam Assessment & Plan: Cervical cancer screening performed according to ASCCP guidelines. Encouraged annual mammogram screening Colonoscopy UTD DXA indicated due to risk factors Labs and immunizations with her primary Encouraged safe sexual practices as indicated Encouraged healthy lifestyle practices with diet and exercise For patients under 50-70yo, I recommend 1200mg  calcium daily and 600IU of vitamin D daily.    Cervical cancer screening -     Cytology - PAP  Vitamin D insufficiency Assessment & Plan: Continue vit D supplementation   Osteoporosis screening Assessment & Plan: Risk factors assessed: Menopause and calcium and/or vitamin D deficiency.  Orders: -     DG Bone Density; Future  Lesion of cervix -     Surgical pathology    Rosalyn Gess, MD

## 2023-04-19 NOTE — Telephone Encounter (Signed)
-----   Message from Rosalyn Gess sent at 04/19/2023 11:44 AM EDT ----- Dxa ordered. Please assist with scheduling.

## 2023-04-19 NOTE — Assessment & Plan Note (Signed)
Risk factors assessed: Menopause and calcium and/or vitamin D deficiency.

## 2023-04-19 NOTE — Assessment & Plan Note (Addendum)
Cervical cancer screening performed according to ASCCP guidelines. Encouraged annual mammogram screening Colonoscopy UTD DXA indicated due to risk factors Labs and immunizations with her primary Encouraged safe sexual practices as indicated Encouraged healthy lifestyle practices with diet and exercise For patients under 50-60yo, I recommend 1200mg  calcium daily and 600IU of vitamin D daily.

## 2023-04-19 NOTE — Patient Instructions (Signed)
For patients under 50-60yo, I recommend 1200mg  calcium daily and 600IU of vitamin D daily. For patients over 60yo, I recommend 1200mg  calcium daily and 800IU of vitamin D daily.  Health Maintenance, Female Adopting a healthy lifestyle and getting preventive care are important in promoting health and wellness. Ask your health care provider about: The right schedule for you to have regular tests and exams. Things you can do on your own to prevent diseases and keep yourself healthy. What should I know about diet, weight, and exercise? Eat a healthy diet  Eat a diet that includes plenty of vegetables, fruits, low-fat dairy products, and lean protein. Do not eat a lot of foods that are high in solid fats, added sugars, or sodium. Maintain a healthy weight Body mass index (BMI) is used to identify weight problems. It estimates body fat based on height and weight. Your health care provider can help determine your BMI and help you achieve or maintain a healthy weight. Get regular exercise Get regular exercise. This is one of the most important things you can do for your health. Most adults should: Exercise for at least 150 minutes each week. The exercise should increase your heart rate and make you sweat (moderate-intensity exercise). Do strengthening exercises at least twice a week. This is in addition to the moderate-intensity exercise. Spend less time sitting. Even light physical activity can be beneficial. Watch cholesterol and blood lipids Have your blood tested for lipids and cholesterol at 60 years of age, then have this test every 5 years. Have your cholesterol levels checked more often if: Your lipid or cholesterol levels are high. You are older than 60 years of age. You are at high risk for heart disease. What should I know about cancer screening? Depending on your health history and family history, you may need to have cancer screening at various ages. This may include screening  for: Breast cancer. Cervical cancer. Colorectal cancer. Skin cancer. Lung cancer. What should I know about heart disease, diabetes, and high blood pressure? Blood pressure and heart disease High blood pressure causes heart disease and increases the risk of stroke. This is more likely to develop in people who have high blood pressure readings or are overweight. Have your blood pressure checked: Every 3-5 years if you are 83-60 years of age. Every year if you are 4 years old or older. Diabetes Have regular diabetes screenings. This checks your fasting blood sugar level. Have the screening done: Once every three years after age 68 if you are at a normal weight and have a low risk for diabetes. More often and at a younger age if you are overweight or have a high risk for diabetes. What should I know about preventing infection? Hepatitis B If you have a higher risk for hepatitis B, you should be screened for this virus. Talk with your health care provider to find out if you are at risk for hepatitis B infection. Hepatitis C Testing is recommended for: Everyone born from [birth year] through 1963. Anyone with known risk factors for hepatitis C. Sexually transmitted infections (STIs) Get screened for STIs, including gonorrhea and chlamydia, if: You are sexually active and are younger than 60 years of age. You are older than 60 years of age and your health care provider tells you that you are at risk for this type of infection. Your sexual activity has changed since you were last screened, and you are at increased risk for chlamydia or gonorrhea. Ask your health care provider if  you are at risk. Ask your health care provider about whether you are at high risk for HIV. Your health care provider may recommend a prescription medicine to help prevent HIV infection. If you choose to take medicine to prevent HIV, you should first get tested for HIV. You should then be tested every 3 months for as long as you  are taking the medicine. Osteoporosis and menopause Osteoporosis is a disease in which the bones lose minerals and strength with aging. This can result in bone fractures. If you are 44 years old or older, or if you are at risk for osteoporosis and fractures, ask your health care provider if you should: Be screened for bone loss. Take a calcium or vitamin D supplement to lower your risk of fractures. Be given hormone replacement therapy (HRT) to treat symptoms of menopause. Follow these instructions at home: Alcohol use Do not drink alcohol if: Your health care provider tells you not to drink. You are pregnant, may be pregnant, or are planning to become pregnant. If you drink alcohol: Limit how much you have to: 0-1 drink a day. Know how much alcohol is in your drink. In the [place] one drink equals one 12 oz bottle of beer (355 mL), one 5 oz glass of wine (148 mL), or one 1 oz glass of hard liquor (44 mL). Lifestyle Do not use any products that contain nicotine or tobacco. These products include cigarettes, chewing tobacco, and vaping devices, such as e-cigarettes. If you need help quitting, ask your health care provider. Do not use street drugs. Do not share needles. Ask your health care provider for help if you need support or information about quitting drugs. General instructions Schedule regular health, dental, and eye exams. Stay current with your vaccines. Tell your health care provider if: You often feel depressed. You have ever been abused or do not feel safe at home. Summary Adopting a healthy lifestyle and getting preventive care are important in promoting health and wellness. Follow your health care provider's instructions about healthy diet, exercising, and getting tested or screened for diseases. Follow your health care provider's instructions on monitoring your cholesterol and blood pressure. This information is not intended to replace advice given to you by your health  care provider. Make sure you discuss any questions you have with your health care provider. Document Revised: 10/26/2020 Document Reviewed: 10/26/2020 Elsevier Patient Education  2024 [employer].

## 2023-04-19 NOTE — Assessment & Plan Note (Signed)
Continue vit D supplementation

## 2023-04-19 NOTE — Telephone Encounter (Signed)
Call placed to patient, left detailed message, ok per dpr. Advised BMD has been ordered by Dr. Kennith Center, you may schedule BMD at [facility] at your convenience. Their number is [phone]. If you need additional assistance return call to office.   New order placed.

## 2023-04-20 LAB — SURGICAL PATHOLOGY

## 2023-04-21 LAB — CYTOLOGY - PAP
Comment: NEGATIVE
Diagnosis: NEGATIVE
High risk HPV: NEGATIVE

## 2023-04-24 DIAGNOSIS — G4733 Obstructive sleep apnea (adult) (pediatric): Secondary | ICD-10-CM | POA: Diagnosis not present

## 2023-04-25 NOTE — Telephone Encounter (Signed)
"  04-20-23 1ST ATTEMPT LVM EG"

## 2023-05-15 ENCOUNTER — Telehealth: Payer: 59 | Admitting: Family Medicine

## 2023-05-15 DIAGNOSIS — B9689 Other specified bacterial agents as the cause of diseases classified elsewhere: Secondary | ICD-10-CM | POA: Diagnosis not present

## 2023-05-15 DIAGNOSIS — J019 Acute sinusitis, unspecified: Secondary | ICD-10-CM | POA: Diagnosis not present

## 2023-05-15 MED ORDER — AMOXICILLIN-POT CLAVULANATE 875-125 MG PO TABS
1.0000 | ORAL_TABLET | Freq: Two times a day (BID) | ORAL | 0 refills | Status: AC
Start: 2023-05-15 — End: 2023-05-22

## 2023-05-15 NOTE — Progress Notes (Signed)

## 2023-05-16 NOTE — Telephone Encounter (Signed)
LDVM on machine per DPR inquiring if pt needed any assistance w/ scheduling her DEXA or also provided her with the number to contact [facility] to schedule at her earliest convenience.

## 2023-05-24 DIAGNOSIS — G4733 Obstructive sleep apnea (adult) (pediatric): Secondary | ICD-10-CM | POA: Diagnosis not present

## 2023-05-26 NOTE — Telephone Encounter (Signed)
Spoke w/ pt and she stated that she spoke w/ someone @ [facility] and stated that she actually wants to have her DXA performed at the [facility] location since that location is more convenient for her and she also reported that she is not planning to schedule until January since insurance is so close to changing. Desires to know first what kind of plan that she is on.   Advised pt that location will be changed on DXA order and will advise her provider and will f/u with her sometime in January if we see that she is not scheduled.   Pt voiced understanding and appreciation for the call.

## 2023-06-02 DIAGNOSIS — G4733 Obstructive sleep apnea (adult) (pediatric): Secondary | ICD-10-CM | POA: Diagnosis not present

## 2023-06-05 DIAGNOSIS — G4733 Obstructive sleep apnea (adult) (pediatric): Secondary | ICD-10-CM | POA: Diagnosis not present

## 2023-07-11 DIAGNOSIS — H5213 Myopia, bilateral: Secondary | ICD-10-CM | POA: Diagnosis not present

## 2023-07-11 DIAGNOSIS — H2513 Age-related nuclear cataract, bilateral: Secondary | ICD-10-CM | POA: Diagnosis not present

## 2023-07-11 DIAGNOSIS — H40053 Ocular hypertension, bilateral: Secondary | ICD-10-CM | POA: Diagnosis not present

## 2023-07-11 DIAGNOSIS — H40003 Preglaucoma, unspecified, bilateral: Secondary | ICD-10-CM | POA: Diagnosis not present

## 2023-07-27 NOTE — Telephone Encounter (Signed)
 LDVM on machine per DPR to f/u w/ pt on getting her DXA scheduled.

## 2023-08-04 NOTE — Telephone Encounter (Signed)
LDVM on machine to remind the pt to call and scheduled her DXA at her earliest convenience.   Encounter closed.

## 2023-09-03 DIAGNOSIS — G4733 Obstructive sleep apnea (adult) (pediatric): Secondary | ICD-10-CM | POA: Diagnosis not present

## 2023-10-13 ENCOUNTER — Telehealth: Admitting: Family Medicine

## 2023-10-13 DIAGNOSIS — H6692 Otitis media, unspecified, left ear: Secondary | ICD-10-CM

## 2023-10-13 MED ORDER — AMOXICILLIN 875 MG PO TABS
875.0000 mg | ORAL_TABLET | Freq: Two times a day (BID) | ORAL | 0 refills | Status: AC
Start: 2023-10-13 — End: 2023-10-23

## 2023-10-13 NOTE — Progress Notes (Signed)
 E-Visit for Ear Pain - Acute Otitis Media   We are sorry that you are not feeling well. Here is how we plan to help!  Based on what you have shared with me it looks like you have Acute Otitis Media.  Acute Otitis Media is an infection of the middle or "inner" ear. This type of infection can cause redness, inflammation, and fluid buildup behind the tympanic membrane (ear drum).  The usual symptoms include: Earache/Pain Fever Upper respiratory symptoms Lack of energy/Fatigue/Malaise Slight hearing loss gradually worsening- if the inner ear fills with fluid What causes middle ear infections? Most middle ear infections occur when an infection such as a cold, leads to a build-up of mucus in the middle ear and causes the Eustachian tube (a thin tube that runs from the middle ear to the back of the nose) to become swollen or blocked.   This means mucus can't drain away properly, making it easier for an infection to spread into the middle ear.  How middle ear infections are treated: Most ear infections clear up within three to five days and don't need any specific treatment. If necessary, tylenol or ibuprofen should be used to relieve pain and a high temperature.  If you develop a fever higher than 102, or any significantly worsening symptoms, this could indicate a more serious infection moving to the middle/inner and needs face to face evaluation in an office by a provider.   Antibiotics aren't routinely used to treat middle ear infections, although they may occasionally be prescribed if symptoms persist or are particularly severe. Given your presentation,   I have prescribed Amoxicillin 875 mg one tablet twice daily for 10 days     Your symptoms should improve over the next 3 days and should resolve in about 7 days. Be sure to complete ALL of the prescription(s) given.  HOME CARE: Wash your hands frequently. If you are prescribed an ear drop, do not place the tip of the bottle on your ear or  touch it with your fingers. You can take Acetaminophen 650 mg every 4-6 hours as needed for pain.  If pain is severe or moderate, you can apply a heating pad (set on low) or hot water bottle (wrapped in a towel) to outer ear for 20 minutes.  This will also increase drainage.  GET HELP RIGHT AWAY IF: Fever is over 102.2 degrees. You develop progressive ear pain or hearing loss. Ear symptoms persist longer than 3 days after treatment.  MAKE SURE YOU: Understand these instructions. Will watch your condition. Will get help right away if you are not doing well or get worse.  Thank you for choosing an e-visit.  Your e-visit answers were reviewed by a board certified advanced clinical practitioner to complete your personal care plan. Depending upon the condition, your plan could have included both over the counter or prescription medications.  Please review your pharmacy choice. Make sure the pharmacy is open so you can pick up the prescription now. If there is a problem, you may contact your provider through [employer] and have the prescription routed to another pharmacy.  Your safety is important to [place]. If you have drug allergies check your prescription carefully.   For the next 24 hours you can use MyChart to ask questions about today's visit, request a non-urgent call back, or ask for a work or school excuse. You will get an email with a survey after your eVisit asking about your experience. We would appreciate your feedback.  I hope that your e-visit has been valuable and will aid in your recovery.   have provided 5 minutes of non face to face time during this encounter for chart review and documentation.

## 2023-10-18 DIAGNOSIS — H40003 Preglaucoma, unspecified, bilateral: Secondary | ICD-10-CM | POA: Diagnosis not present

## 2023-10-18 DIAGNOSIS — H5213 Myopia, bilateral: Secondary | ICD-10-CM | POA: Diagnosis not present

## 2023-10-18 DIAGNOSIS — H2513 Age-related nuclear cataract, bilateral: Secondary | ICD-10-CM | POA: Diagnosis not present

## 2023-10-18 DIAGNOSIS — H40053 Ocular hypertension, bilateral: Secondary | ICD-10-CM | POA: Diagnosis not present

## 2023-12-20 ENCOUNTER — Telehealth: Payer: Self-pay | Admitting: Orthopedic Surgery

## 2023-12-20 NOTE — Telephone Encounter (Signed)
 DR. MARGRETTE  Patient called lvm to call her.  I called the patient back and was unable to leave message her voicemail is full

## 2024-01-05 DIAGNOSIS — M79645 Pain in left finger(s): Secondary | ICD-10-CM | POA: Diagnosis not present

## 2024-01-05 DIAGNOSIS — M5451 Vertebrogenic low back pain: Secondary | ICD-10-CM | POA: Diagnosis not present

## 2024-01-08 ENCOUNTER — Encounter: Admitting: Orthopedic Surgery

## 2024-01-22 ENCOUNTER — Encounter: Admitting: Orthopedic Surgery

## 2024-02-09 ENCOUNTER — Encounter: Payer: Self-pay | Admitting: Radiology

## 2024-04-22 ENCOUNTER — Encounter: Payer: Self-pay | Admitting: Radiology

## 2024-05-29 ENCOUNTER — Other Ambulatory Visit (HOSPITAL_COMMUNITY): Payer: Self-pay | Admitting: Adult Health Nurse Practitioner

## 2024-05-29 DIAGNOSIS — Z1231 Encounter for screening mammogram for malignant neoplasm of breast: Secondary | ICD-10-CM

## 2024-06-10 ENCOUNTER — Ambulatory Visit (HOSPITAL_COMMUNITY)

## 2024-06-17 ENCOUNTER — Ambulatory Visit (HOSPITAL_COMMUNITY)
